# Patient Record
Sex: Female | Born: 1985 | Race: Black or African American | Hispanic: No | Marital: Single | Smoking: Never smoker
Health system: Southern US, Community
[De-identification: ages and names within clinical notes are randomized; demographics above are authoritative.]

## PROBLEM LIST (undated history)

## (undated) DIAGNOSIS — G43909 Migraine, unspecified, not intractable, without status migrainosus: Secondary | ICD-10-CM

## (undated) DIAGNOSIS — R55 Syncope and collapse: Secondary | ICD-10-CM

## (undated) HISTORY — DX: Migraine, unspecified, not intractable, without status migrainosus: G43.909

## (undated) HISTORY — PX: NO PAST SURGERIES: SHX2092

---

## 2004-12-05 ENCOUNTER — Encounter (INDEPENDENT_AMBULATORY_CARE_PROVIDER_SITE_OTHER): Payer: Self-pay | Admitting: *Deleted

## 2004-12-19 ENCOUNTER — Encounter (INDEPENDENT_AMBULATORY_CARE_PROVIDER_SITE_OTHER): Payer: Self-pay | Admitting: Specialist

## 2004-12-19 ENCOUNTER — Ambulatory Visit: Payer: Self-pay | Admitting: Family Medicine

## 2005-01-16 ENCOUNTER — Ambulatory Visit: Payer: Self-pay | Admitting: Family Medicine

## 2005-06-05 ENCOUNTER — Emergency Department (HOSPITAL_COMMUNITY): Admission: EM | Admit: 2005-06-05 | Discharge: 2005-06-05 | Payer: Self-pay | Admitting: Emergency Medicine

## 2005-12-25 ENCOUNTER — Emergency Department (HOSPITAL_COMMUNITY): Admission: EM | Admit: 2005-12-25 | Discharge: 2005-12-25 | Payer: Self-pay | Admitting: Emergency Medicine

## 2006-07-04 DIAGNOSIS — M545 Low back pain, unspecified: Secondary | ICD-10-CM | POA: Insufficient documentation

## 2006-07-04 DIAGNOSIS — G43909 Migraine, unspecified, not intractable, without status migrainosus: Secondary | ICD-10-CM | POA: Insufficient documentation

## 2006-07-04 DIAGNOSIS — F329 Major depressive disorder, single episode, unspecified: Secondary | ICD-10-CM | POA: Insufficient documentation

## 2006-07-05 ENCOUNTER — Encounter (INDEPENDENT_AMBULATORY_CARE_PROVIDER_SITE_OTHER): Payer: Self-pay | Admitting: *Deleted

## 2008-06-01 ENCOUNTER — Emergency Department (HOSPITAL_COMMUNITY): Admission: EM | Admit: 2008-06-01 | Discharge: 2008-06-01 | Payer: Self-pay | Admitting: Emergency Medicine

## 2009-07-03 ENCOUNTER — Emergency Department (HOSPITAL_COMMUNITY): Admission: EM | Admit: 2009-07-03 | Discharge: 2009-07-03 | Payer: Self-pay | Admitting: Family Medicine

## 2010-02-12 ENCOUNTER — Emergency Department (HOSPITAL_COMMUNITY)
Admission: EM | Admit: 2010-02-12 | Discharge: 2010-02-12 | Payer: Self-pay | Source: Home / Self Care | Admitting: Emergency Medicine

## 2010-08-21 LAB — COMPREHENSIVE METABOLIC PANEL
ALT: 10 U/L (ref 0–35)
AST: 15 U/L (ref 0–37)
Albumin: 3.6 g/dL (ref 3.5–5.2)
Alkaline Phosphatase: 44 U/L (ref 39–117)
BUN: 7 mg/dL (ref 6–23)
CO2: 27 mEq/L (ref 19–32)
Calcium: 9.2 mg/dL (ref 8.4–10.5)
Chloride: 106 mEq/L (ref 96–112)
Creatinine, Ser: 0.61 mg/dL (ref 0.4–1.2)
GFR calc Af Amer: >60 mL/min (ref >60)
GFR calc non Af Amer: >60 mL/min (ref >60)
Glucose, Bld: 97 mg/dL (ref 70–99)
Potassium: 3.8 mEq/L (ref 3.5–5.1)
Sodium: 136 mEq/L (ref 135–145)
Total Bilirubin: 0.5 mg/dL (ref 0.3–1.2)
Total Protein: 6.1 g/dL (ref 6.0–8.3)

## 2010-08-21 LAB — CBC
HCT: 34.7 % — ABNORMAL LOW (ref 36.0–46.0)
Hemoglobin: 11.4 g/dL — ABNORMAL LOW (ref 12.0–15.0)
MCHC: 32.8 g/dL (ref 30.0–36.0)
MCV: 89.8 fL (ref 78.0–100.0)
Platelets: 220 10*3/uL (ref 150–400)
RBC: 3.86 MIL/uL — ABNORMAL LOW (ref 3.87–5.11)
RDW: 12.8 % (ref 11.5–15.5)
WBC: 5.6 10*3/uL (ref 4.0–10.5)

## 2010-08-21 LAB — URINALYSIS, ROUTINE W REFLEX MICROSCOPIC
Bilirubin Urine: NEGATIVE
Ketones, ur: 15 mg/dL — AB
Nitrite: NEGATIVE
Specific Gravity, Urine: 1.027 (ref 1.005–1.030)
Urobilinogen, UA: 0.2 mg/dL (ref 0.0–1.0)
pH: 6 (ref 5.0–8.0)

## 2010-08-21 LAB — ABO/RH: ABO/RH(D): O POS

## 2010-08-21 LAB — DIFFERENTIAL
Basophils Absolute: 0 10*3/uL (ref 0.0–0.1)
Basophils Relative: 1 % (ref 0–1)
Eosinophils Absolute: 0.1 10*3/uL (ref 0.0–0.7)
Eosinophils Relative: 2 % (ref 0–5)
Lymphocytes Relative: 28 % (ref 12–46)
Lymphs Abs: 1.5 10*3/uL (ref 0.7–4.0)
Monocytes Absolute: 0.4 10*3/uL (ref 0.1–1.0)
Monocytes Relative: 7 % (ref 3–12)
Neutro Abs: 3.6 10*3/uL (ref 1.7–7.7)
Neutrophils Relative %: 64 % (ref 43–77)

## 2010-08-21 LAB — WET PREP, GENITAL: Trich, Wet Prep: NONE SEEN

## 2010-08-21 LAB — POCT PREGNANCY, URINE: Preg Test, Ur: POSITIVE

## 2010-08-21 LAB — GC/CHLAMYDIA PROBE AMP, GENITAL: Chlamydia, DNA Probe: NEGATIVE

## 2012-01-09 ENCOUNTER — Encounter (HOSPITAL_COMMUNITY): Payer: Self-pay | Admitting: Emergency Medicine

## 2012-01-09 ENCOUNTER — Emergency Department (HOSPITAL_COMMUNITY): Payer: Self-pay

## 2012-01-09 ENCOUNTER — Emergency Department (HOSPITAL_COMMUNITY)
Admission: EM | Admit: 2012-01-09 | Discharge: 2012-01-10 | Disposition: A | Discharge status: Home / Self Care | Payer: Self-pay | Attending: Emergency Medicine | Admitting: Emergency Medicine

## 2012-01-09 DIAGNOSIS — S92919A Unspecified fracture of unspecified toe(s), initial encounter for closed fracture: Secondary | ICD-10-CM | POA: Insufficient documentation

## 2012-01-09 DIAGNOSIS — S92514A Nondisplaced fracture of proximal phalanx of right lesser toe(s), initial encounter for closed fracture: Secondary | ICD-10-CM

## 2012-01-09 DIAGNOSIS — IMO0002 Reserved for concepts with insufficient information to code with codable children: Secondary | ICD-10-CM | POA: Insufficient documentation

## 2012-01-09 HISTORY — DX: Syncope and collapse: R55

## 2012-01-09 MED ORDER — IBUPROFEN 800 MG PO TABS: 800.0000 mg | ORAL_TABLET | Freq: Three times a day (TID) | ORAL | Status: AC | PRN

## 2012-01-09 MED ORDER — HYDROCODONE-ACETAMINOPHEN 5-325 MG PO TABS: 1.0000 | ORAL_TABLET | Freq: Four times a day (QID) | ORAL | Status: AC | PRN

## 2012-01-09 MED ORDER — OXYCODONE-ACETAMINOPHEN 5-325 MG PO TABS
1.0000 | ORAL_TABLET | Freq: Once | ORAL | Status: AC
  Administered 2012-01-09: 1 via ORAL
  Filled 2012-01-09: qty 1

## 2012-01-09 NOTE — ED Notes (Signed)
ICE PACK APPLIED AT INJURED AREA FOR COMFORT WHILE WAITING FOR PROVIDER.

## 2012-01-09 NOTE — ED Notes (Signed)
Pt reports hurting foot on Friday, by hitting on wall while running; pt reports having continued pain to 2,3,4,5th toes, also having pain when walking on top of foot with bruising and minimal swelling

## 2012-01-10 NOTE — ED Provider Notes (Signed)
History     CSN: [id number]  Arrival date & time 01/09/12  1952   First MD Initiated Contact with Patient 01/09/12 2254      Chief Complaint  Patient presents with  . Foot Injury    (Consider location/radiation/quality/duration/timing/severity/associated sxs/prior treatment) Patient is a 26 y.o. female presenting with foot injury. The history is provided by the patient.  Foot Injury  Incident onset: 6 days ago  The incident occurred at home (while running kicked a door). The injury mechanism was a direct blow. The pain is present in the right foot and right toes. The quality of the pain is described as throbbing. The pain is at a severity of 6/10. The pain is moderate. The pain has been constant since onset. Pertinent negatives include no numbness, no inability to bear weight, no loss of motion, no muscle weakness, no loss of sensation and no tingling. She reports no foreign bodies present. The symptoms are aggravated by palpation. She has tried nothing for the symptoms.    Past Medical History  Diagnosis Date  . Syncopal episodes     History reviewed. No pertinent past surgical history.  History reviewed. No pertinent family history.  History  Substance Use Topics  . Smoking status: Never Smoker   . Smokeless tobacco: Not on file  . Alcohol Use: Yes     occasion    OB History    Grav Para Term Preterm Abortions TAB SAB Ect Mult Living                  Review of Systems  Constitutional: Negative for fever, diaphoresis and activity change.  HENT: Negative for congestion and neck pain.   Respiratory: Negative for cough.   Genitourinary: Negative for dysuria.  Musculoskeletal: Negative for myalgias.  Skin: Negative for color change and wound.  Neurological: Negative for tingling, numbness and headaches.  All other systems reviewed and are negative.    Allergies  Review of patient's allergies indicates no known allergies.  Home Medications   Current Outpatient  Rx  Name Route Sig Dispense Refill  . HYDROCODONE-ACETAMINOPHEN 5-325 MG PO TABS Oral Take 1 tablet by mouth every 6 (six) hours as needed for pain. 15 tablet 0  . IBUPROFEN 800 MG PO TABS Oral Take 1 tablet (800 mg total) by mouth every 8 (eight) hours as needed for pain. 21 tablet 0    BP 113/74  Pulse 71  Temp 98.5 F (36.9 C) (Oral)  Resp 16  SpO2 98%  LMP 01/08/2012  Physical Exam  Nursing note and vitals reviewed. Constitutional: She appears well-developed and well-nourished. No distress.  HENT:  Head: Normocephalic and atraumatic.  Eyes: Conjunctivae and EOM are normal.  Neck: Normal range of motion. Neck supple.  Cardiovascular:       Intact distal pulses, capillary refill < 3 seconds  Musculoskeletal:       ttp over all toes on right foot, primarily the pinky toe. Right fore foot also tender. Pain with ROM of these areas. All other extremities with normal ROM  Neurological:       No sensory deficit  Skin: She is not diaphoretic.       Skin intact, no tenting    ED Course  Procedures (including critical care time)  Labs Reviewed - No data to display Dg Foot Complete Right  01/09/2012  *RADIOLOGY REPORT*  Clinical Data: Foot injury with pain.  RIGHT FOOT COMPLETE - 3+ VIEW  Comparison: 02/12/2010  Findings: Three-view exam  shows a transverse fracture through the little toe proximal phalanx.  No other acute fracture is evident. There is no subluxation or dislocation.  IMPRESSION: Transverse fracture involving the little toe proximal phalanx.   Original Report Authenticated By: ERIC A. MANSELL, M.D.      1. Closed nondisplaced fracture of proximal phalanx of lesser toe of right foot       MDM  Toe fracture  Patient X-Ray positive for fracture. Pain managed in ED. Pt advised to follow up with orthopedics if symptoms persist. Patient toes buddy taped, placed in postop boot and crutches given. Conservative therapy recommended and discussed. Patient will be dc home &  is agreeable with above plan.         Jaci Carrel, [place] 01/10/12 [phone]

## 2012-01-11 NOTE — ED Provider Notes (Signed)
History/physical exam/procedure(s) were performed by non-physician practitioner and as supervising physician I was immediately available for consultation/collaboration. I have reviewed all notes and am in agreement with care and plan.   Milayna Rotenberg [address] [place], MD 01/11/12 0118 

## 2013-02-06 ENCOUNTER — Other Ambulatory Visit: Payer: Self-pay | Admitting: Family Medicine

## 2013-02-06 ENCOUNTER — Other Ambulatory Visit (HOSPITAL_COMMUNITY)
Admission: RE | Admit: 2013-02-06 | Discharge: 2013-02-06 | Disposition: A | Discharge status: Home / Self Care | Payer: BC Managed Care – PPO | Source: Ambulatory Visit | Attending: Family Medicine | Admitting: Family Medicine

## 2013-02-06 DIAGNOSIS — Z124 Encounter for screening for malignant neoplasm of cervix: Secondary | ICD-10-CM | POA: Insufficient documentation

## 2013-02-06 DIAGNOSIS — Z1151 Encounter for screening for human papillomavirus (HPV): Secondary | ICD-10-CM | POA: Insufficient documentation

## 2013-02-06 DIAGNOSIS — R87810 Cervical high risk human papillomavirus (HPV) DNA test positive: Secondary | ICD-10-CM | POA: Insufficient documentation

## 2013-08-12 ENCOUNTER — Other Ambulatory Visit: Payer: Self-pay | Admitting: Family Medicine

## 2013-08-12 ENCOUNTER — Other Ambulatory Visit (HOSPITAL_COMMUNITY)
Admission: RE | Admit: 2013-08-12 | Discharge: 2013-08-12 | Disposition: A | Discharge status: Home / Self Care | Payer: BC Managed Care – PPO | Source: Ambulatory Visit | Attending: Family Medicine | Admitting: Family Medicine

## 2013-08-12 DIAGNOSIS — R87610 Atypical squamous cells of undetermined significance on cytologic smear of cervix (ASC-US): Secondary | ICD-10-CM | POA: Insufficient documentation

## 2013-08-27 ENCOUNTER — Emergency Department (INDEPENDENT_AMBULATORY_CARE_PROVIDER_SITE_OTHER): Payer: Worker's Compensation

## 2013-08-27 ENCOUNTER — Encounter (HOSPITAL_COMMUNITY): Payer: Self-pay | Admitting: Emergency Medicine

## 2013-08-27 ENCOUNTER — Emergency Department (INDEPENDENT_AMBULATORY_CARE_PROVIDER_SITE_OTHER)
Admission: EM | Admit: 2013-08-27 | Discharge: 2013-08-27 | Disposition: A | Discharge status: Home / Self Care | Payer: Worker's Compensation | Source: Home / Self Care | Attending: Family Medicine | Admitting: Family Medicine

## 2013-08-27 DIAGNOSIS — IMO0002 Reserved for concepts with insufficient information to code with codable children: Secondary | ICD-10-CM

## 2013-08-27 DIAGNOSIS — Y9229 Other specified public building as the place of occurrence of the external cause: Secondary | ICD-10-CM

## 2013-08-27 DIAGNOSIS — S9000XA Contusion of unspecified ankle, initial encounter: Secondary | ICD-10-CM | POA: Diagnosis not present

## 2013-08-27 MED ORDER — DICLOFENAC SODIUM 50 MG PO TBEC: 50.0000 mg | DELAYED_RELEASE_TABLET | Freq: Two times a day (BID) | ORAL | Status: DC | PRN

## 2013-08-27 NOTE — Discharge Instructions (Signed)
Thank you for coming in today. Use rest ice and elevation.  Take diclofenac as needed Return to work Monday  Contusion A contusion is the result of an injury to the skin and underlying tissues and is usually caused by direct trauma. The injury results in the appearance of a bruise on the skin overlying the injured tissues. Contusions cause rupture and bleeding of the small capillaries and blood vessels and affect function, because the bleeding infiltrates muscles, tendons, nerves, or other soft tissues.  SYMPTOMS   Swelling and often a hard lump in the injured area, either superficial or deep.  Pain and tenderness over the area of the contusion.  Feeling of firmness when pressure is exerted over the contusion.  Discoloration under the skin, beginning with redness and progressing to the characteristic "black and blue" bruise. CAUSES  A contusion is typically the result of direct trauma. This is often by a blunt object.  RISK INCREASES WITH:  Sports that have a high likelihood of trauma (football, boxing, ice hockey, soccer, field hockey, martial arts, basketball, and baseball).  Sports that make falling from a height likely (high-jumping, pole-vaulting, skating, or gymnastics).  Any bleeding disorder (hemophilia) or taking medications that affect clotting (aspirin, nonsteroidal anti-inflammatory medications, or warfarin [Coumadin]).  Inadequate protection of exposed areas during contact sports. PREVENTION  Maintain physical fitness:  Joint and muscle flexibility.  Strength and endurance.  Coordination.  Wear proper protective equipment. Make sure it fits correctly. PROGNOSIS  Contusions typically heal without any complications. Healing time varies with the severity of injury and intake of medications that affect clotting. Contusions usually heal in 1 to 4 weeks. RELATED COMPLICATIONS   Damage to nearby nerves or blood vessels, causing numbness, coldness, or  paleness.  Compartment syndrome.  Bleeding into the soft tissues that leads to disability.  Infiltrative-type bleeding, leading to the calcification and impaired function of the injured muscle (rare).  Prolonged healing time if usual activities are resumed too soon.  Infection if the skin over the injury site is broken.  Fracture of the bone underlying the contusion.  Stiffness in the joint where the injured muscle crosses. TREATMENT  Treatment initially consists of resting the injured area as well as medication and ice to reduce inflammation. The use of a compression bandage may also be helpful in minimizing inflammation. As pain diminishes and movement is tolerated, the joint where the affected muscle crosses should be moved to prevent stiffness and the shortening (contracture) of the joint. Movement of the joint should begin as soon as possible. It is also important to work on maintaining strength within the affected muscles. Occasionally, extra padding over the area of contusion may be recommended before returning to sports, particularly if re-injury is likely.  MEDICATION   If pain relief is necessary these medications are often recommended:  Nonsteroidal anti-inflammatory medications, such as aspirin and ibuprofen.  Other minor pain relievers, such as acetaminophen, are often recommended.  Prescription pain relievers may be given by your caregiver. Use only as directed and only as much as you need. HEAT AND COLD  Cold treatment (icing) relieves pain and reduces inflammation. Cold treatment should be applied for 10 to 15 minutes every 2 to 3 hours for inflammation and pain and immediately after any activity that aggravates your symptoms. Use ice packs or an ice massage. (To do an ice massage fill a large styrofoam cup with water and freeze. Tear a small amount of foam from the top so ice protrudes. Massage ice firmly  over the injured area in a circle about the size of a  softball.)  Heat treatment may be used prior to performing the stretching and strengthening activities prescribed by your caregiver, physical therapist, or athletic trainer. Use a heat pack or a warm soak. SEEK MEDICAL CARE IF:   Symptoms get worse or do not improve despite treatment in a few days.  You have difficulty moving a joint.  Any extremity becomes extremely painful, numb, pale, or cool (This is an emergency!).  Medication produces any side effects (bleeding, upset stomach, or allergic reaction).  Signs of infection (drainage from skin, headache, muscle aches, dizziness, fever, or general ill feeling) occur if skin was broken. Document Released: 04/23/2005 Document Revised: 07/16/2011 Document Reviewed: 08/05/2008 [facility] Patient Information 2014 [employer] [place].

## 2013-08-27 NOTE — ED Notes (Signed)
Pt reports she inj her right ankle at work on 4/21 Reports she was hit by a forklift on lateral part of right foot/ankle Reports pain w/activity No swelling visible; ambulated well to exam room w/NAD Alert w/no signs of acute distress.

## 2013-08-27 NOTE — ED Provider Notes (Signed)
Sabrina ReasonerSelina L Robinson is a 28 y.o. female who presents to Urgent Care today for right ankle and foot pain. Patient is a [employer] employee. At work on April 21 a forklift pushed the metal hamper into her right foot and ankle. She notes pain and swelling and tenderness along the lateral aspect of her ankle and foot. She notes some pain with ambulation. She denies any significant radiating pain weakness or numbness. She's tried ice rest and elevation which have not helped much. She has not tried any medications. She feels well otherwise. She's here today for evaluation.   Past Medical History  Diagnosis Date  . Syncopal episodes    History  Substance Use Topics  . Smoking status: Never Smoker   . Smokeless tobacco: Not on file  . Alcohol Use: Yes     Comment: occasion   ROS as above Medications: No current facility-administered medications for this encounter.   Current Outpatient Prescriptions  Medication Sig Dispense Refill  . norethindrone-ethinyl estradiol (MICROGESTIN,JUNEL,LOESTRIN) 1-20 MG-MCG tablet Take 1 tablet by mouth daily.      . diclofenac (VOLTAREN) 50 MG EC tablet Take 1 tablet (50 mg total) by mouth 2 (two) times daily as needed.  60 tablet  0    Exam:  BP 116/61  Pulse 84  Temp(Src) 98.1 F (36.7 C) (Oral)  Resp 16  SpO2 100%  LMP 08/16/2013 Gen: Well NAD Right ankle: Normal-appearing no significant swelling. Tender palpation of the lateral malleolus. Range of motion is intact.  Ankle stable to ligament testing.  Capillary refill sensation and pulses are intact.  Right foot: Tender along the fifth metatarsal and phalanx.  Motion is intact.   No results found for this or any previous visit (from the past 24 hour(s)). Dg Ankle Complete Right  08/27/2013   CLINICAL DATA:  ANKLE INJURY  EXAM: RIGHT ANKLE - COMPLETE 3+ VIEW  COMPARISON:  None.  FINDINGS: There is no evidence of fracture, dislocation, or joint effusion. There is no evidence of arthropathy or other focal bone  abnormality. Soft tissues are unremarkable.  IMPRESSION: Negative.   Electronically Signed   By: Salome HolmesHector  Cooper M.D.   On: 08/27/2013 17:42   Dg Foot Complete Right  08/27/2013   CLINICAL DATA:  Trauma, injury, pain  EXAM: RIGHT FOOT COMPLETE - 3+ VIEW  COMPARISON:  08/27/2013  FINDINGS: There is no evidence of fracture or dislocation. There is no evidence of arthropathy or other focal bone abnormality. Soft tissues are unremarkable.  IMPRESSION: No acute osseous finding.   Electronically Signed   By: Ruel Favorsrevor  Shick M.D.   On: 08/27/2013 17:42    Assessment and Plan: 28 y.o. female with foot and ankle contusion. Plan to use diclofenac. Work note provided. Post office employment form filled out. Followup with primary care provider.  Discussed warning signs or symptoms. Please see discharge instructions. Patient expresses understanding.    Rodolph BongEvan S Lucian Baswell, MD 08/27/13 [phone]

## 2014-05-06 ENCOUNTER — Other Ambulatory Visit: Payer: Self-pay | Admitting: Family Medicine

## 2014-05-06 ENCOUNTER — Other Ambulatory Visit (HOSPITAL_COMMUNITY)
Admission: RE | Admit: 2014-05-06 | Discharge: 2014-05-06 | Disposition: A | Discharge status: Home / Self Care | Payer: BC Managed Care – PPO | Source: Ambulatory Visit | Attending: Family Medicine | Admitting: Family Medicine

## 2014-05-06 DIAGNOSIS — R87610 Atypical squamous cells of undetermined significance on cytologic smear of cervix (ASC-US): Secondary | ICD-10-CM | POA: Diagnosis present

## 2014-05-06 DIAGNOSIS — Z01411 Encounter for gynecological examination (general) (routine) with abnormal findings: Secondary | ICD-10-CM | POA: Diagnosis not present

## 2014-05-06 DIAGNOSIS — Z1151 Encounter for screening for human papillomavirus (HPV): Secondary | ICD-10-CM | POA: Diagnosis present

## 2014-05-11 LAB — CYTOLOGY - PAP

## 2014-05-31 ENCOUNTER — Other Ambulatory Visit: Payer: Self-pay | Admitting: Obstetrics and Gynecology

## 2014-12-13 ENCOUNTER — Other Ambulatory Visit: Payer: Self-pay | Admitting: Obstetrics and Gynecology

## 2014-12-13 ENCOUNTER — Other Ambulatory Visit (HOSPITAL_COMMUNITY)
Admission: RE | Admit: 2014-12-13 | Discharge: 2014-12-13 | Disposition: A | Discharge status: Home / Self Care | Payer: Federal, State, Local not specified - PPO | Source: Ambulatory Visit | Attending: Obstetrics and Gynecology | Admitting: Obstetrics and Gynecology

## 2014-12-13 DIAGNOSIS — Z01419 Encounter for gynecological examination (general) (routine) without abnormal findings: Secondary | ICD-10-CM | POA: Diagnosis present

## 2014-12-15 LAB — CYTOLOGY - PAP

## 2015-06-29 ENCOUNTER — Other Ambulatory Visit: Payer: Self-pay | Admitting: Obstetrics and Gynecology

## 2015-06-29 ENCOUNTER — Other Ambulatory Visit (HOSPITAL_COMMUNITY)
Admission: RE | Admit: 2015-06-29 | Discharge: 2015-06-29 | Disposition: A | Discharge status: Home / Self Care | Payer: Federal, State, Local not specified - PPO | Source: Ambulatory Visit | Attending: Obstetrics and Gynecology | Admitting: Obstetrics and Gynecology

## 2015-06-29 DIAGNOSIS — Z01419 Encounter for gynecological examination (general) (routine) without abnormal findings: Secondary | ICD-10-CM | POA: Diagnosis present

## 2015-07-01 LAB — CYTOLOGY - PAP

## 2015-07-11 ENCOUNTER — Ambulatory Visit: Payer: Self-pay | Admitting: Neurology

## 2015-08-17 ENCOUNTER — Ambulatory Visit: Payer: Self-pay | Admitting: Neurology

## 2015-08-17 DIAGNOSIS — Z029 Encounter for administrative examinations, unspecified: Secondary | ICD-10-CM

## 2015-11-10 ENCOUNTER — Ambulatory Visit (INDEPENDENT_AMBULATORY_CARE_PROVIDER_SITE_OTHER): Payer: Federal, State, Local not specified - PPO | Admitting: Neurology

## 2015-11-10 ENCOUNTER — Encounter: Payer: Self-pay | Admitting: Neurology

## 2015-11-10 VITALS — BP 116/78 | HR 67 | Ht 63.25 in | Wt 144.8 lb

## 2015-11-10 DIAGNOSIS — R55 Syncope and collapse: Secondary | ICD-10-CM | POA: Diagnosis not present

## 2015-11-10 DIAGNOSIS — G43009 Migraine without aura, not intractable, without status migrainosus: Secondary | ICD-10-CM

## 2015-11-10 DIAGNOSIS — IMO0002 Reserved for concepts with insufficient information to code with codable children: Secondary | ICD-10-CM

## 2015-11-10 DIAGNOSIS — R404 Transient alteration of awareness: Secondary | ICD-10-CM | POA: Insufficient documentation

## 2015-11-10 MED ORDER — NORTRIPTYLINE HCL 10 MG PO CAPS: 20.0000 mg | ORAL_CAPSULE | Freq: Every day | ORAL | Status: DC

## 2015-11-10 MED ORDER — ONDANSETRON 4 MG PO TBDP: 4.0000 mg | ORAL_TABLET | Freq: Three times a day (TID) | ORAL | Status: DC | PRN

## 2015-11-10 MED ORDER — RIZATRIPTAN BENZOATE 10 MG PO TBDP: 10.0000 mg | ORAL_TABLET | ORAL | Status: DC | PRN

## 2015-11-10 NOTE — Progress Notes (Signed)
PATIENT: Sabrina ReasonerSelina L Robinson DOB: November 13, 1985  Chief Complaint  Patient presents with  . Migraine    She is here to discuss migraine management (diagnosed in elementary school). Report migraines to be more frequent and severe recently.  She estimates 2-3 migraine days weekly.  Headaches are often associated with nausea, visual disturbances and dizziness.  She has been on Topamax in the past but was unable to tolerate the side effects.  She has had trigger point injections in the past.  She is currently just using sumatriptan 100mg  tablets for pain, which has not been helpful.     HISTORICAL  Sabrina ReasonerSelina L [name]  is a 30 years old right-handed female,seen in refer by  her primary care doctor Consuella Loselaine Gfor evaluation of chronic migraine headache in November 10 2015  She reported history of migraine headaches since middle school, her typical migraine are right lateralized severe pounding headache with associated light noise sensitivity, nauseous, spreceded by a gradual building up of pressure headache at right temporoparietal region, since2016 she has increased frequency of headache, about couple times a week, to currently daily basis now since May 2017,  She was evaluated by headache specialist Dr. Neale BurlyFreeman in 2015, has tried preventive medication Topamax up to 100 mg twice a day, she could not tolerate it due to heart flutter, dizziness, there was no noticeable improvement in her headache after 6 months. She also received trigger point injection without significant improvement,  For abortive treatment, She has tried Imitrex since May 2017, she tried 3 times, it works well for her headaches take away her headache in 2 hours, but she had choking sensation, it is unbearable for her,  Trigger for her migraines are stress, sleep deprivation, weather change, hungry, dehydration, strong smells, bright light,  Because of frequent headaches, she has been taking BC powder at least 4 times a week, up to 3 packs each day,  frequent almost daily Excedrin Migraine use, for over one year, also tried different over-the-counter medication aspirin Tylenol ibuprofen Aleve without helping her headaches  Today she is also very concerned about her passing out episode, she had multiple episodes since 2002, each episode is similar, she described to episode happened while taking a hot shower, she had sudden onset dizziness, wooshing sound in her ear, blurry vision, and transient loss of consciousness quickly recovered after she landed on the floor, there was no seizure-like activity noticed,  some of the episode triggered by painful stimulation, such as broken her toes.  REVIEW OF SYSTEMS: Full 14 system review of systems performed and notable only for as above  ALLERGIES: No Known Allergies  HOME MEDICATIONS: Current Outpatient Prescriptions  Medication Sig Dispense Refill  . norethindrone-ethinyl estradiol (MICROGESTIN,JUNEL,LOESTRIN) 1-20 MG-MCG tablet Take 1 tablet by mouth daily.    . SUMAtriptan (IMITREX) 100 MG tablet Take 100 mg by mouth as needed for migraine. May repeat in 2 hours if headache persists or recurs.     No current facility-administered medications for this visit.    PAST MEDICAL HISTORY: Past Medical History  Diagnosis Date  . Syncopal episodes   . Migraines     PAST SURGICAL HISTORY: Past Surgical History  Procedure Laterality Date  . No past surgeries      FAMILY HISTORY: Family History  Problem Relation Age of Onset  . Hypertension Mother     SOCIAL HISTORY:  Social History   Social History  . Marital Status: Single    Spouse Name: N/A  . Number of  Children: 0  . Years of Education: Bachelors   Occupational History  . 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 Smoking status: Never Smoker   . Smokeless tobacco: Not on file  . Alcohol Use: 0.0 oz/week    0 Standard drinks or equivalent per week     Comment: occasionally  . Drug Use: No  . Sexual Activity:  Not on file   Other Topics Concern  . Not on file   Social History Narrative   Lives at home with her mother.   Right-handed.   1 cup caffeine daily.     PHYSICAL EXAM   Filed Vitals:   11/10/15 0952  BP: 116/78  Pulse: 67  Height: 5' 3.25" (1.607 m)  Weight: 144 lb 12 oz (65.658 kg)    Not recorded      Body mass index is 25.42 kg/(m^2).  PHYSICAL EXAMNIATION:  Gen: NAD, conversant, well nourised, obese, well groomed                     Cardiovascular: Regular rate rhythm, no peripheral edema, warm, nontender. Eyes: Conjunctivae clear without exudates or hemorrhage Neck: Supple, no carotid bruise. Pulmonary: Clear to auscultation bilaterally   NEUROLOGICAL EXAM:  MENTAL STATUS: Speech:    Speech is normal; fluent and spontaneous with normal comprehension.  Cognition:     Orientation to time, place and person     Normal recent and remote memory     Normal Attention span and concentration     Normal Language, naming, repeating,spontaneous speech     Fund of knowledge   CRANIAL NERVES: CN II: Visual fields are full to confrontation. Fundoscopic exam is normal with sharp discs and no vascular changes. Pupils are round equal and briskly reactive to light. CN III, IV, VI: extraocular movement are normal. No ptosis. CN V: Facial sensation is intact to pinprick in all 3 divisions bilaterally. Corneal responses are intact.  CN VII: Face is symmetric with normal eye closure and smile. CN VIII: Hearing is normal to rubbing fingers CN IX, X: Palate elevates symmetrically. Phonation is normal. CN XI: Head turning and shoulder shrug are intact CN XII: Tongue is midline with normal movements and no atrophy.  MOTOR: There is no pronator drift of out-stretched arms. Muscle bulk and tone are normal. Muscle strength is normal.  REFLEXES: Reflexes are 2+ and symmetric at the biceps, triceps, knees, and ankles. Plantar responses are flexor.  SENSORY: Intact to light touch,  pinprick, positional sensation and vibratory sensation are intact in fingers and toes.  COORDINATION: Rapid alternating movements and fine finger movements are intact. There is no dysmetria on finger-to-nose and heel-knee-shin.    GAIT/STANCE: Posture is normal. Gait is steady with normal steps, base, arm swing, and turning. Heel and toe walking are normal. Tandem gait is normal.  Romberg is absent.   DIAGNOSTIC DATA (LABS, IMAGING, TESTING) - I reviewed patient records, labs, notes, testing and imaging myself where available.   ASSESSMENT AND PLAN  DONALYN SCHNEEBERGER is a 30 y.o. female   Chronic migraine headaches,  A component of medicine rebound headache  I have advised her stop daily over-the-counter medication use,  Passing out episodes  Most suggestive of syncope episodes  She desires further evaluation, proceed with MRI of the brain with without contrast EEG  Laboratory evaluations,   Levert Feinstein, M.D. Ph.D.  [facility] [address] [place], [place] [address] Ph: [phone])  [phone] Fax: [phone]  CC: Maurice SmallElaine Griffin, MD

## 2015-11-11 LAB — COMPREHENSIVE METABOLIC PANEL
ALBUMIN: 3.5 g/dL (ref 3.5–5.5)
ALT: 8 IU/L (ref 0–32)
AST: 15 IU/L (ref 0–40)
Albumin/Globulin Ratio: 1.3 (ref 1.2–2.2)
Alkaline Phosphatase: 53 IU/L (ref 39–117)
BUN / CREAT RATIO: 14 (ref 9–23)
BUN: 11 mg/dL (ref 6–20)
Bilirubin Total: <0.2 mg/dL (ref 0.0–1.2)
CALCIUM: 8.6 mg/dL — AB (ref 8.7–10.2)
CO2: 21 mmol/L (ref 18–29)
CREATININE: 0.77 mg/dL (ref 0.57–1.00)
Chloride: 102 mmol/L (ref 96–106)
GFR calc Af Amer: 120 mL/min/{1.73_m2} (ref >59)
GFR, EST NON AFRICAN AMERICAN: 104 mL/min/{1.73_m2} (ref >59)
GLOBULIN, TOTAL: 2.6 g/dL (ref 1.5–4.5)
Glucose: 86 mg/dL (ref 65–99)
POTASSIUM: 4.4 mmol/L (ref 3.5–5.2)
SODIUM: 139 mmol/L (ref 134–144)
Total Protein: 6.1 g/dL (ref 6.0–8.5)

## 2015-11-11 LAB — CBC
HEMATOCRIT: 36 % (ref 34.0–46.6)
HEMOGLOBIN: 11.1 g/dL (ref 11.1–15.9)
MCH: 27.7 pg (ref 26.6–33.0)
MCHC: 30.8 g/dL — AB (ref 31.5–35.7)
MCV: 90 fL (ref 79–97)
Platelets: 255 10*3/uL (ref 150–379)
RBC: 4.01 x10E6/uL (ref 3.77–5.28)
RDW: 14.4 % (ref 12.3–15.4)
WBC: 4.4 10*3/uL (ref 3.4–10.8)

## 2015-11-11 LAB — VITAMIN B12: Vitamin B-12: 247 pg/mL (ref 211–946)

## 2015-11-11 LAB — TSH: TSH: 2.81 u[IU]/mL (ref 0.450–4.500)

## 2015-11-22 ENCOUNTER — Encounter: Payer: Self-pay | Admitting: Neurology

## 2015-11-22 ENCOUNTER — Ambulatory Visit (INDEPENDENT_AMBULATORY_CARE_PROVIDER_SITE_OTHER): Payer: Federal, State, Local not specified - PPO | Admitting: Neurology

## 2015-11-22 DIAGNOSIS — R55 Syncope and collapse: Secondary | ICD-10-CM

## 2015-11-22 DIAGNOSIS — IMO0002 Reserved for concepts with insufficient information to code with codable children: Secondary | ICD-10-CM

## 2015-11-22 DIAGNOSIS — G43009 Migraine without aura, not intractable, without status migrainosus: Secondary | ICD-10-CM

## 2015-11-22 NOTE — Procedures (Signed)
   HISTORY: 30 years old female, presented with frequent migraine headaches, she also has passing out episode.  TECHNIQUE:  16 channel EEG was performed based on standard 10-16 international system. One channel was dedicated to EKG, which has demonstrates normal sinus rhythm of 90 beats per minutes.  Upon awakening, the posterior background activity was well-developed, in alpha range, 9 Hz, reactive to eye opening and closure.  There was no evidence of epileptiform discharge.  Photic stimulation was performed, which induced a symmetric photic driving.  Hyperventilation was performed, there was no abnormality elicit.  No sleep was achieved.  CONCLUSION: This is a  normal awake EEG.  There is no electrodiagnostic evidence of epileptiform discharge.  Levert FeinsteinYijun Billy Turvey, M.D. Ph.D.  [facility] [address] [place], [place] [address] Phone: [phone] Fax:      [phone]

## 2015-11-23 ENCOUNTER — Ambulatory Visit (INDEPENDENT_AMBULATORY_CARE_PROVIDER_SITE_OTHER): Payer: Federal, State, Local not specified - PPO

## 2015-11-23 DIAGNOSIS — R55 Syncope and collapse: Secondary | ICD-10-CM | POA: Diagnosis not present

## 2015-11-23 DIAGNOSIS — IMO0002 Reserved for concepts with insufficient information to code with codable children: Secondary | ICD-10-CM

## 2015-11-23 DIAGNOSIS — G43009 Migraine without aura, not intractable, without status migrainosus: Secondary | ICD-10-CM | POA: Diagnosis not present

## 2015-11-24 MED ORDER — GADOPENTETATE DIMEGLUMINE 469.01 MG/ML IV SOLN: 15.0000 mL | Freq: Once | INTRAVENOUS | Status: DC | PRN

## 2016-02-02 DIAGNOSIS — R197 Diarrhea, unspecified: Secondary | ICD-10-CM | POA: Diagnosis not present

## 2016-02-02 DIAGNOSIS — R112 Nausea with vomiting, unspecified: Secondary | ICD-10-CM | POA: Diagnosis not present

## 2016-02-02 DIAGNOSIS — R109 Unspecified abdominal pain: Secondary | ICD-10-CM | POA: Diagnosis not present

## 2016-02-16 ENCOUNTER — Ambulatory Visit: Payer: Federal, State, Local not specified - PPO | Admitting: Neurology

## 2016-05-10 DIAGNOSIS — Z0289 Encounter for other administrative examinations: Secondary | ICD-10-CM

## 2016-07-06 ENCOUNTER — Other Ambulatory Visit: Payer: Self-pay | Admitting: Obstetrics and Gynecology

## 2016-07-06 ENCOUNTER — Other Ambulatory Visit (HOSPITAL_COMMUNITY)
Admission: RE | Admit: 2016-07-06 | Discharge: 2016-07-06 | Disposition: A | Discharge status: Home / Self Care | Payer: Federal, State, Local not specified - PPO | Source: Ambulatory Visit | Attending: Obstetrics and Gynecology | Admitting: Obstetrics and Gynecology

## 2016-07-06 DIAGNOSIS — Z1151 Encounter for screening for human papillomavirus (HPV): Secondary | ICD-10-CM | POA: Diagnosis not present

## 2016-07-06 DIAGNOSIS — Z01419 Encounter for gynecological examination (general) (routine) without abnormal findings: Secondary | ICD-10-CM | POA: Diagnosis not present

## 2016-07-11 LAB — CYTOLOGY - PAP
DIAGNOSIS: NEGATIVE
HPV: NOT DETECTED

## 2016-07-19 ENCOUNTER — Ambulatory Visit (INDEPENDENT_AMBULATORY_CARE_PROVIDER_SITE_OTHER): Payer: Federal, State, Local not specified - PPO | Admitting: Neurology

## 2016-07-19 ENCOUNTER — Encounter: Payer: Self-pay | Admitting: Neurology

## 2016-07-19 VITALS — BP 116/77 | HR 71 | Ht 63.25 in | Wt 141.0 lb

## 2016-07-19 DIAGNOSIS — G43009 Migraine without aura, not intractable, without status migrainosus: Secondary | ICD-10-CM

## 2016-07-19 DIAGNOSIS — R55 Syncope and collapse: Secondary | ICD-10-CM | POA: Diagnosis not present

## 2016-07-19 DIAGNOSIS — IMO0002 Reserved for concepts with insufficient information to code with codable children: Secondary | ICD-10-CM

## 2016-07-19 MED ORDER — NORTRIPTYLINE HCL 25 MG PO CAPS: 50.0000 mg | ORAL_CAPSULE | Freq: Every day | ORAL | 4 refills | Status: DC

## 2016-07-19 MED ORDER — RIZATRIPTAN BENZOATE 10 MG PO TBDP: 10.0000 mg | ORAL_TABLET | ORAL | 11 refills | Status: DC | PRN

## 2016-07-19 NOTE — Patient Instructions (Signed)
May take Maxalt together with 2 tablets of Aleve, Zofran, even Benadryl, and then go to sleep for prolonged, severe headaches.

## 2016-07-19 NOTE — Progress Notes (Signed)
PATIENT: Sabrina Robinson DOB: 1985/11/28  Chief Complaint  Patient presents with  . Migraine    She does not feel the frequency of her migraines has reduced with nortriptyline 20mg , qhs.  Reports still having 3-4 migraines monthly.  Feels Maxalt works well for her pain.     HISTORICAL  Sabrina Robinson  is a 31 years old right-handed female,seen in refer by  her primary care doctor Consuella Loselaine Gfor evaluation of chronic migraine headache in November 10 2015  She reported history of migraine headaches since middle school, her typical migraine are right lateralized severe pounding headache with associated light noise sensitivity, nauseous, spreceded by a gradual building up of pressure headache at right temporoparietal region, since2016 she has increased frequency of headache, about couple times a week, to currently daily basis now since May 2017,  She was evaluated by headache specialist Dr. Neale BurlyFreeman in 2015, has tried preventive medication Topamax up to 100 mg twice a day, she could not tolerate it due to heart flutter, dizziness, there was no noticeable improvement in her headache after 6 months. She also received trigger point injection without significant improvement,  For abortive treatment, She has tried Imitrex since May 2017, she tried 3 times, it works well for her headaches take away her headache in 2 hours, but she had choking sensation, it is unbearable for her,  Trigger for her migraines are stress, sleep deprivation, weather change, hungry, dehydration, strong smells, bright light,  Because of frequent headaches, she has been taking BC powder at least 4 times a week, up to 3 packs each day, frequent almost daily Excedrin Migraine use, for over one year, also tried different over-the-counter medication aspirin Tylenol ibuprofen Aleve without helping her headaches  Today she is also very concerned about her passing out episode, she had multiple episodes since 2002, each episode is similar, she  described to episode happened while taking a hot shower, she had sudden onset dizziness, wooshing sound in her ear, blurry vision, and transient loss of consciousness quickly recovered after she landed on the floor, there was no seizure-like activity noticed,  some of the episode triggered by painful stimulation, such as broken her toes.  UPDATE July 19 2016: I have personally reviewed MRI of the brain with without contrast in July 2017, no acute intracranial abnormality. EEG was normal in July 2017 Laboratory in July 2017, normal CMP, CBC, TSH, B12  She has no more passing out episode, she continue have migraine 2-3 times each months, still has to take Maxalt 2-3 time each month, about 1 hour to abort the headache, but she feels fatigued afterwards.   REVIEW OF SYSTEMS: Full 14 system review of systems performed and notable only for as above  ALLERGIES: No Known Allergies  HOME MEDICATIONS: Current Outpatient Prescriptions  Medication Sig Dispense Refill  . norethindrone-ethinyl estradiol (MICROGESTIN,JUNEL,LOESTRIN) 1-20 MG-MCG tablet Take 1 tablet by mouth daily.    . nortriptyline (PAMELOR) 10 MG capsule Take 2 capsules (20 mg total) by mouth at bedtime. 60 capsule 11  . ondansetron (ZOFRAN ODT) 4 MG disintegrating tablet Take 1 tablet (4 mg total) by mouth every 8 (eight) hours as needed for nausea or vomiting. 20 tablet 3  . rizatriptan (MAXALT-MLT) 10 MG disintegrating tablet Take 1 tablet (10 mg total) by mouth as needed. May repeat in 2 hours if needed 15 tablet 6  . SUMAtriptan (IMITREX) 100 MG tablet Take 100 mg by mouth as needed for migraine. May repeat in 2  hours if headache persists or recurs.     No current facility-administered medications for this visit.    Facility-Administered Medications Ordered in Other Visits  Medication Dose Route Frequency Provider Last Rate Last Dose  . gadopentetate dimeglumine (MAGNEVIST) injection 15 mL  15 mL Intravenous Once PRN Levert Feinstein,  MD        PAST MEDICAL HISTORY: Past Medical History:  Diagnosis Date  . Migraines   . Syncopal episodes     PAST SURGICAL HISTORY: Past Surgical History:  Procedure Laterality Date  . NO PAST SURGERIES      FAMILY HISTORY: Family History  Problem Relation Age of Onset  . Hypertension Mother     SOCIAL HISTORY:  Social History   Social History  . Marital status: Single    Spouse name: N/A  . Number of children: 0  . 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 Smoking status: Never Smoker  . Smokeless tobacco: Never Used  . Alcohol use 0.0 oz/week     Comment: occasionally  . Drug use: No  . Sexual activity: Not on file   Other Topics Concern  . Not on file   Social History Narrative   Lives at home with her mother.   Right-handed.   1 cup caffeine daily.     PHYSICAL EXAM   Vitals:   07/19/16 1211  BP: 116/77  Pulse: 71  Weight: 141 lb (64 kg)  Height: 5' 3.25" (1.607 m)    Not recorded      Body mass index is 24.78 kg/m.  PHYSICAL EXAMNIATION:  Gen: NAD, conversant, well nourised, obese, well groomed                     Cardiovascular: Regular rate rhythm, no peripheral edema, warm, nontender. Eyes: Conjunctivae clear without exudates or hemorrhage Neck: Supple, no carotid bruise. Pulmonary: Clear to auscultation bilaterally   NEUROLOGICAL EXAM:  MENTAL STATUS: Speech:    Speech is normal; fluent and spontaneous with normal comprehension.  Cognition:     Orientation to time, place and person     Normal recent and remote memory     Normal Attention span and concentration     Normal Language, naming, repeating,spontaneous speech     Fund of knowledge   CRANIAL NERVES: CN II: Visual fields are full to confrontation. Fundoscopic exam is normal with sharp discs and no vascular changes. Pupils are round equal and briskly reactive to light. CN III, IV, VI:  extraocular movement are normal. No ptosis. CN V: Facial sensation is intact to pinprick in all 3 divisions bilaterally. Corneal responses are intact.  CN VII: Face is symmetric with normal eye closure and smile. CN VIII: Hearing is normal to rubbing fingers CN IX, X: Palate elevates symmetrically. Phonation is normal. CN XI: Head turning and shoulder shrug are intact CN XII: Tongue is midline with normal movements and no atrophy.  MOTOR: There is no pronator drift of out-stretched arms. Muscle bulk and tone are normal. Muscle strength is normal.  REFLEXES: Reflexes are 2+ and symmetric at the biceps, triceps, knees, and ankles. Plantar responses are flexor.  SENSORY: Intact to light touch, pinprick, positional sensation and vibratory sensation are intact in fingers and toes.  COORDINATION: Rapid alternating movements and fine finger movements are intact. There is no dysmetria on finger-to-nose and heel-knee-shin.    GAIT/STANCE: Posture is normal. Gait  is steady with normal steps, base, arm swing, and turning. Heel and toe walking are normal. Tandem gait is normal.  Romberg is absent.   DIAGNOSTIC DATA (LABS, IMAGING, TESTING) - I reviewed patient records, labs, notes, testing and imaging myself where available.   ASSESSMENT AND PLAN  Sabrina Robinson is a 31 y.o. female   Chronic migraine headaches,  She has stopped frequent over-the-counter medication use  Maxalt works well for her most of the time  I have suggested her for prolonged protracted migraine, she may take Maxalt together with Aleve and Zofran as needed.,  Will increase her nortriptyline to 25 mg 2 tablets every night as preventative medications   passing out episode  Most consistent with syncope, she has no recurrent episode recently.  Levert Feinstein, M.D. Ph.D.  [facility] [address] [place], [place] [address] Ph: [phone] Fax: [phone]  CC: Maurice Small, MD

## 2016-09-25 DIAGNOSIS — N912 Amenorrhea, unspecified: Secondary | ICD-10-CM | POA: Diagnosis not present

## 2016-10-08 DIAGNOSIS — K080 Exfoliation of teeth due to systemic causes: Secondary | ICD-10-CM | POA: Diagnosis not present

## 2016-10-31 DIAGNOSIS — K080 Exfoliation of teeth due to systemic causes: Secondary | ICD-10-CM | POA: Diagnosis not present

## 2017-01-02 DIAGNOSIS — N898 Other specified noninflammatory disorders of vagina: Secondary | ICD-10-CM | POA: Diagnosis not present

## 2017-01-22 ENCOUNTER — Ambulatory Visit: Payer: Federal, State, Local not specified - PPO | Admitting: Nurse Practitioner

## 2017-02-27 NOTE — Progress Notes (Signed)
[facility]  PATIENT: Sabrina Robinson DOB: 05-18-1985   REASON FOR VISIT: Follow-up for migraines, episodes of passing out HISTORY FROM: Patient    HISTORY OF PRESENT ILLNESS:Sabrina Robinson  is a 31 years old right-handed female,seen in refer by  her primary care doctor Consuella Lose Gfor evaluation of chronic migraine headache in November 10 2015  She reported history of migraine headaches since middle school, her typical migraine are right lateralized severe pounding headache with associated light noise sensitivity, nauseous, spreceded by a gradual building up of pressure headache at right temporoparietal region, since2016 she has increased frequency of headache, about couple times a week, to currently daily basis now since May 2017,  She was evaluated by headache specialist Dr. Neale Burly in 2015, has tried preventive medication Topamax up to 100 mg twice a day, she could not tolerate it due to heart flutter, dizziness, there was no noticeable improvement in her headache after 6 months. She also received trigger point injection without significant improvement,  For abortive treatment, She has tried Imitrex since May 2017, she tried 3 times, it works well for her headaches take away her headache in 2 hours, but she had choking sensation, it is unbearable for her,  Trigger for her migraines are stress, sleep deprivation, weather change, hungry, dehydration, strong smells, bright light, Because of frequent headaches, she has been taking BC powder at least 4 times a week, up to 3 packs each day, frequent almost daily Excedrin Migraine use, for over one year, also tried different over-the-counter medication aspirin Tylenol ibuprofen Aleve without helping her headaches Today she is also very concerned about her passing out episode, she had multiple episodes since 2002, each episode is similar, she described to episode happened while taking a hot shower, she had sudden onset dizziness,  wooshing sound in her ear, blurry vision, and transient loss of consciousness quickly recovered after she landed on the floor, there was no seizure-like activity noticed,  some of the episode triggered by painful stimulation, such as broken her toes.  UPDATE July 19 2016:YY I have personally reviewed MRI of the brain with without contrast in July 2017, no acute intracranial abnormality. EEG was normal in July 2017 Laboratory in July 2017, normal CMP, CBC, TSH, B12  She has no more passing out episode, she continue have migraine 2-3 times each months, still has to take Maxalt 2-3 time each month, about 1 hour to abort the headache, but she feels fatigued afterwards.  UPDATE 10/25/2018[name], 31 year old female returns for follow-up for migraines and passing out episodes. She is having approximately 2 migraines per month usually around her cycle. She denies any syncopal episodes since last seen. Maxalt works acutely but she is still fatigued afterwards. Her migraine triggers are bright lights,  strong smells, lack of sleep dehydration and weather changes. She returns for reevaluation   REVIEW OF SYSTEMS: Full 14 system review of systems performed and notable only for those listed, all others are neg:  Constitutional: neg  Cardiovascular: neg Ear/Nose/Throat: neg  Skin: neg Eyes: neg Respiratory: neg Gastroitestinal: neg  Hematology/Lymphatic: neg  Endocrine: neg Musculoskeletal:neg Allergy/Immunology: neg Neurological: Migraine headaches Psychiatric: neg Sleep : neg   ALLERGIES: No Known Allergies  HOME MEDICATIONS: Outpatient Medications Prior to Visit  Medication Sig Dispense Refill  . norethindrone-ethinyl estradiol (MICROGESTIN,JUNEL,LOESTRIN) 1-20 MG-MCG tablet Take 1 tablet by mouth daily.    . nortriptyline (PAMELOR) 25 MG capsule Take 2 capsules (50 mg total) by mouth at bedtime. 180 capsule  4  . ondansetron (ZOFRAN ODT) 4 MG disintegrating tablet Take 1 tablet (4  mg total) by mouth every 8 (eight) hours as needed for nausea or vomiting. 20 tablet 3  . rizatriptan (MAXALT-MLT) 10 MG disintegrating tablet Take 1 tablet (10 mg total) by mouth as needed. May repeat in 2 hours if needed 15 tablet 11   No facility-administered medications prior to visit.     PAST MEDICAL HISTORY: Past Medical History:  Diagnosis Date  . Migraines   . Syncopal episodes     PAST SURGICAL HISTORY: Past Surgical History:  Procedure Laterality Date  . NO PAST SURGERIES      FAMILY HISTORY: Family History  Problem Relation Age of Onset  . Hypertension Mother     SOCIAL HISTORY: Social History   Social History  . Marital status: Single    Spouse name: N/A  . Number of children: 0  . 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 Smoking status: Never Smoker  . Smokeless tobacco: Never Used  . Alcohol use 0.0 oz/week     Comment: occasionally  . Drug use: No  . Sexual activity: Not on file   Other Topics Concern  . Not on file   Social History Narrative   Lives at home with her mother.   Right-handed.   1 cup caffeine daily.     PHYSICAL EXAM  Vitals:   02/28/17 0741  BP: (!) 104/38  Pulse: 68  Weight: 141 lb (64 kg)  Height: 5\' 4"  (1.626 m)   Body mass index is 24.2 kg/m.  Generalized: Well developed, in no acute distress  Head: normocephalic and atraumatic,. Oropharynx benign  Neck: Supple,  Musculoskeletal: No deformity   Neurological examination   Mentation: Alert oriented to time, place, history taking. Attention span and concentration appropriate. Recent and remote memory intact.  Follows all commands speech and language fluent.   Cranial nerve II-XII: Pupils were equal round reactive to light extraocular movements were full, visual field were full on confrontational test. Facial sensation and strength were normal. hearing was intact to finger rubbing  bilaterally. Uvula tongue midline. head turning and shoulder shrug were normal and symmetric.Tongue protrusion into cheek strength was normal. Motor: normal bulk and tone, full strength in the BUE, BLE, fine finger movements normal, no pronator drift. No focal weakness Sensory: normal and symmetric to light touch, In the upper and lower extremities  Coordination: finger-nose-finger, heel-to-shin bilaterally, no dysmetria Reflexes: Brachioradialis 2/2, biceps 2/2, triceps 2/2, patellar 2/2, Achilles 2/2, plantar responses were flexor bilaterally. Gait and Station: Rising up from seated position without assistance, normal stance,  moderate stride, good arm swing, smooth turning, able to perform tiptoe, and heel walking without difficulty. Tandem gait is steady  DIAGNOSTIC DATA (LABS, IMAGING, TESTING) - I reviewed patient records, labs, notes, testing and imaging myself where available.  Lab Results  Component Value Date   WBC 4.4 11/10/2015   HGB 11.1 11/10/2015   HCT 36.0 11/10/2015   MCV 90 11/10/2015   PLT 255 11/10/2015      Component Value Date/Time   NA 139 11/10/2015 1040   K 4.4 11/10/2015 1040   CL 102 11/10/2015 1040   CO2 21 11/10/2015 1040   GLUCOSE 86 11/10/2015 1040   GLUCOSE 97 06/01/2008 1510   BUN 11 11/10/2015 1040   CREATININE 0.77 11/10/2015 1040   CALCIUM 8.6 (L) 11/10/2015 1040  PROT 6.1 11/10/2015 1040   ALBUMIN 3.5 11/10/2015 1040   AST 15 11/10/2015 1040   ALT 8 11/10/2015 1040   ALKPHOS 53 11/10/2015 1040   BILITOT <0.2 11/10/2015 1040   GFRNONAA 104 11/10/2015 1040   GFRAA 120 11/10/2015 1040    Lab Results  Component Value Date   VITAMINB12 247 11/10/2015   Lab Results  Component Value Date   TSH 2.810 11/10/2015      ASSESSMENT AND PLAN  30 y.o. year old female  has a past medical history of Migraines and Syncopal episodes. here with To follow-up for her migraines and syncopal episodes. No recent syncopal episodes. She has 1-2 migraines  per month.    PLAN: Continue Nortriptyline 25 mg 2 tablets every night preventive medication does not need refills  Continue Maxalt acutely does not need refills Continue Zofran when necessary nausea will refill Avoid migraine triggers Follow-up in 8 months I spent 15 min  in total face to face time with the patient more than 50% of which was spent counseling and coordination of care, reviewing test results reviewing medications and discussing and reviewing the diagnosis of migraine, and avoiding migraine triggers. Patient to call for any syncopal episodes. Stay well hydrated  Nilda Riggs, [facility], [facility], APRN  [facility] [address] [place], [place] [address] [phone]

## 2017-02-28 ENCOUNTER — Encounter: Payer: Self-pay | Admitting: Nurse Practitioner

## 2017-02-28 ENCOUNTER — Ambulatory Visit (INDEPENDENT_AMBULATORY_CARE_PROVIDER_SITE_OTHER): Payer: Federal, State, Local not specified - PPO | Admitting: Nurse Practitioner

## 2017-02-28 VITALS — BP 104/38 | HR 68 | Ht 64.0 in | Wt 141.0 lb

## 2017-02-28 DIAGNOSIS — G43009 Migraine without aura, not intractable, without status migrainosus: Secondary | ICD-10-CM

## 2017-02-28 NOTE — Progress Notes (Signed)
I have reviewed and agreed above plan. 

## 2017-02-28 NOTE — Patient Instructions (Signed)
Continue Nortriptyline 25 mg 2 tablets every night preventive medication Continue Maxalt acutely Continue Zofran when necessary nausea Avoid migraine triggers Follow-up in 8 months

## 2017-06-15 DIAGNOSIS — S46002A Unspecified injury of muscle(s) and tendon(s) of the rotator cuff of left shoulder, initial encounter: Secondary | ICD-10-CM | POA: Diagnosis not present

## 2017-07-02 DIAGNOSIS — M542 Cervicalgia: Secondary | ICD-10-CM | POA: Diagnosis not present

## 2017-07-02 DIAGNOSIS — M25512 Pain in left shoulder: Secondary | ICD-10-CM | POA: Diagnosis not present

## 2017-07-10 DIAGNOSIS — M7542 Impingement syndrome of left shoulder: Secondary | ICD-10-CM | POA: Diagnosis not present

## 2017-07-10 DIAGNOSIS — M25512 Pain in left shoulder: Secondary | ICD-10-CM | POA: Diagnosis not present

## 2017-07-16 DIAGNOSIS — M7542 Impingement syndrome of left shoulder: Secondary | ICD-10-CM | POA: Diagnosis not present

## 2017-07-16 DIAGNOSIS — M25512 Pain in left shoulder: Secondary | ICD-10-CM | POA: Diagnosis not present

## 2017-07-19 DIAGNOSIS — M25512 Pain in left shoulder: Secondary | ICD-10-CM | POA: Diagnosis not present

## 2017-07-19 DIAGNOSIS — M7542 Impingement syndrome of left shoulder: Secondary | ICD-10-CM | POA: Diagnosis not present

## 2017-07-25 DIAGNOSIS — M25512 Pain in left shoulder: Secondary | ICD-10-CM | POA: Diagnosis not present

## 2017-07-25 DIAGNOSIS — M7542 Impingement syndrome of left shoulder: Secondary | ICD-10-CM | POA: Diagnosis not present

## 2017-07-30 DIAGNOSIS — K080 Exfoliation of teeth due to systemic causes: Secondary | ICD-10-CM | POA: Diagnosis not present

## 2017-07-30 DIAGNOSIS — M25512 Pain in left shoulder: Secondary | ICD-10-CM | POA: Diagnosis not present

## 2017-07-30 DIAGNOSIS — M7542 Impingement syndrome of left shoulder: Secondary | ICD-10-CM | POA: Diagnosis not present

## 2017-08-05 DIAGNOSIS — Z01419 Encounter for gynecological examination (general) (routine) without abnormal findings: Secondary | ICD-10-CM | POA: Diagnosis not present

## 2017-08-05 DIAGNOSIS — N760 Acute vaginitis: Secondary | ICD-10-CM | POA: Diagnosis not present

## 2017-08-05 DIAGNOSIS — Z113 Encounter for screening for infections with a predominantly sexual mode of transmission: Secondary | ICD-10-CM | POA: Diagnosis not present

## 2017-08-08 DIAGNOSIS — M7542 Impingement syndrome of left shoulder: Secondary | ICD-10-CM | POA: Diagnosis not present

## 2017-08-08 DIAGNOSIS — M25512 Pain in left shoulder: Secondary | ICD-10-CM | POA: Diagnosis not present

## 2017-08-12 ENCOUNTER — Ambulatory Visit (INDEPENDENT_AMBULATORY_CARE_PROVIDER_SITE_OTHER): Payer: Federal, State, Local not specified - PPO

## 2017-08-12 ENCOUNTER — Encounter (HOSPITAL_COMMUNITY): Payer: Self-pay | Admitting: Emergency Medicine

## 2017-08-12 ENCOUNTER — Ambulatory Visit (HOSPITAL_COMMUNITY)
Admission: EM | Admit: 2017-08-12 | Discharge: 2017-08-12 | Disposition: A | Discharge status: Home / Self Care | Payer: Federal, State, Local not specified - PPO | Attending: Family Medicine | Admitting: Family Medicine

## 2017-08-12 ENCOUNTER — Other Ambulatory Visit: Payer: Self-pay

## 2017-08-12 DIAGNOSIS — R1013 Epigastric pain: Secondary | ICD-10-CM | POA: Diagnosis not present

## 2017-08-12 DIAGNOSIS — Z3202 Encounter for pregnancy test, result negative: Secondary | ICD-10-CM | POA: Diagnosis not present

## 2017-08-12 DIAGNOSIS — R1084 Generalized abdominal pain: Secondary | ICD-10-CM

## 2017-08-12 DIAGNOSIS — R1031 Right lower quadrant pain: Secondary | ICD-10-CM | POA: Diagnosis not present

## 2017-08-12 DIAGNOSIS — R1032 Left lower quadrant pain: Secondary | ICD-10-CM | POA: Diagnosis not present

## 2017-08-12 LAB — POCT URINALYSIS DIP (DEVICE)
BILIRUBIN URINE: NEGATIVE
Glucose, UA: 100 mg/dL — AB
HGB URINE DIPSTICK: NEGATIVE
Ketones, ur: NEGATIVE mg/dL
Leukocytes, UA: NEGATIVE
NITRITE: NEGATIVE
PH: 6 (ref 5.0–8.0)
Protein, ur: NEGATIVE mg/dL
Specific Gravity, Urine: 1.025 (ref 1.005–1.030)
Urobilinogen, UA: 0.2 mg/dL (ref 0.0–1.0)

## 2017-08-12 LAB — POCT PREGNANCY, URINE: PREG TEST UR: NEGATIVE

## 2017-08-12 LAB — GLUCOSE, CAPILLARY: GLUCOSE-CAPILLARY: 76 mg/dL (ref 65–99)

## 2017-08-12 MED ORDER — GI COCKTAIL ~~LOC~~
30.0000 mL | Freq: Once | ORAL | Status: AC
  Administered 2017-08-12: 30 mL via ORAL

## 2017-08-12 MED ORDER — GI COCKTAIL ~~LOC~~
ORAL | Status: AC
  Filled 2017-08-12: qty 30

## 2017-08-12 MED ORDER — OMEPRAZOLE 20 MG PO CPDR: 20.0000 mg | DELAYED_RELEASE_CAPSULE | Freq: Every day | ORAL | 0 refills | Status: DC

## 2017-08-12 NOTE — ED Provider Notes (Signed)
[facility]    CSN: [id number] Arrival date & time: 08/12/17  1426     History   Chief Complaint Chief Complaint  Patient presents with  . Abdominal Pain    HPI Sabrina Robinson is a 32 y.o. female.   Sabrina Robinson presents with complaints of epigastric and left upper abdominal pain. States two days ago woke up with low abdominal pain and had two bm's. This pain has mildly improved but now with upper abdominal pain. Laying flat increases the pain. Has been eating and drinking, this does not increase pain. States as soon as she eats she feels she need to have a bm as if she has pelvic pressure. Still passing gas. Had a bm today which was normal. Denies any recent constipation. No fevers. Denies any previous similar. Denies any vaginal symptoms such as discharge itching or burning. Denies urinary symptoms. Took ibuprofen which did not help. Hx of migraines, no other contributing medical history. No previous abdominal surgeries.    ROS per HPI.      Past Medical History:  Diagnosis Date  . Migraines   . Syncopal episodes     Patient Active Problem List   Diagnosis Date Noted  . Passed out [facility](HCC) 11/10/2015  . DEPRESSIVE DISORDER, NOS 07/04/2006  . Migraine headache 07/04/2006  . BACK PAIN, LOW 07/04/2006    Past Surgical History:  Procedure Laterality Date  . NO PAST SURGERIES      OB History   None      Home Medications    Prior to Admission medications   Medication Sig Start Date End Date Taking? Authorizing Provider  norethindrone-ethinyl estradiol (MICROGESTIN,JUNEL,LOESTRIN) 1-20 MG-MCG tablet Take 1 tablet by mouth daily.    [provider]  nortriptyline (PAMELOR) 25 MG capsule Take 2 capsules (50 mg total) by mouth at bedtime. 07/19/16   Levert FeinsteinYan, Yijun, MD  omeprazole (PRILOSEC) 20 MG capsule Take 1 capsule (20 mg total) by mouth daily. 08/12/17   Georgetta HaberBurky, Natalie B, NP  ondansetron (ZOFRAN ODT) 4 MG disintegrating tablet Take 1 tablet (4 mg total) by  mouth every 8 (eight) hours as needed for nausea or vomiting. 11/10/15   Levert FeinsteinYan, Yijun, MD  rizatriptan (MAXALT-MLT) 10 MG disintegrating tablet Take 1 tablet (10 mg total) by mouth as needed. May repeat in 2 hours if needed 07/19/16   Levert FeinsteinYan, Yijun, MD    Family History Family History  Problem Relation Age of Onset  . Hypertension Mother     Social History Social History   Tobacco Use  . Smoking status: Never Smoker  . Smokeless tobacco: Never Used  Substance Use Topics  . Alcohol use: Yes    Alcohol/week: 0.0 oz    Comment: occasionally  . Drug use: No     Allergies   Patient has no known allergies.   Review of Systems Review of Systems   Physical Exam Triage Vital Signs ED Triage Vitals  Enc Vitals Group     BP 08/12/17 1457 112/76     Pulse Rate 08/12/17 1457 80     Resp 08/12/17 1457 18     Temp 08/12/17 1457 97.9 F (36.6 C)     Temp Source 08/12/17 1457 Oral     SpO2 08/12/17 1457 100 %     Weight --      Height --      Head Circumference --      Peak Flow --      Pain Score 08/12/17 1455 7  Pain Loc --      Pain Edu? --      Excl. in GC? --    No data found.  Updated Vital Signs BP 112/76 (BP Location: Left Arm)   Pulse 80   Temp 97.9 F (36.6 C) (Oral)   Resp 18   SpO2 100%   Visual Acuity Right Eye Distance:   Left Eye Distance:   Bilateral Distance:    Right Eye Near:   Left Eye Near:    Bilateral Near:     Physical Exam  Constitutional: She is oriented to person, place, and time. She appears well-developed and well-nourished. No distress.  Cardiovascular: Normal rate, regular rhythm and normal heart sounds.  Pulmonary/Chest: Effort normal and breath sounds normal.  Abdominal: Soft. Bowel sounds are normal. There is no hepatosplenomegaly, splenomegaly or hepatomegaly. There is tenderness in the right upper quadrant, right lower quadrant, epigastric area, suprapubic area and left upper quadrant. There is no rigidity, no rebound, no  guarding, no CVA tenderness, no tenderness at McBurney's point and negative Murphy's sign. No hernia.  Neurological: She is alert and oriented to person, place, and time.  Skin: Skin is warm and dry.     UC Treatments / Results  Labs (all labs ordered are listed, but only abnormal results are displayed) Labs Reviewed  POCT URINALYSIS DIP (DEVICE) - Abnormal; Notable for the following components:      Result Value   Glucose, UA 100 (*)    All other components within normal limits  GLUCOSE, CAPILLARY  POCT PREGNANCY, URINE    EKG None Radiology Dg Abd 2 Views  Result Date: 08/12/2017 CLINICAL DATA:  Abdominal pain, left lower and right lower quadrant pain EXAM: ABDOMEN - 2 VIEW COMPARISON:  None. FINDINGS: Diaphragm not completely included on upright view which limits evaluation for free air. Nonobstructed gas pattern. Oval opacity slightly to the left of midline over the upper sacrum, possibly corresponding to a navel piercing as indicated in the study nodes. Focal calcification or sclerotic lesion in the right iliac bone. Punctate calcification in the right pelvis. IMPRESSION: 1. Nonobstructed gas pattern 2. Possible punctate calcification in the right hemipelvis. CT KUB follow-up if symptomatology suggests stone disease 3. Oval opacity slightly to the left of midline may correspond to history of navel ring. Electronically Signed   By: Jasmine Pang M.D.   On: 08/12/2017 16:08    Procedures Procedures (including critical care time)  Medications Ordered in UC Medications  gi cocktail (Maalox,Lidocaine,Donnatal) (30 mLs Oral Given 08/12/17 1552)     Initial Impression / Assessment and Plan / UC Course  I have reviewed the triage vital signs and the nursing notes.  Pertinent labs & imaging results that were available during my care of the patient were reviewed by me and considered in my medical decision making (see chart for details).     Non toxic in appearance. Afebrile. Without  tachypnea or tachycardia. Gi cocktail provided in clinic. Without significant pain relief. Abdominal xray without changes to gas patterns. Possible stone to right side noted per radiology. Without cva tenderness, severe pain, blood in urine. Discussed with patient, differentials to include gas, gerd, stone, pancreatitis, cholecystitis. Patient agreeable to follow up with PCP for recheck. Will trial omeprazole. Push fluids, ibuprofen for pain. Return precautions provided and discussed as may need further imaging to further evaluate source of pain. Patient verbalized understanding and agreeable to plan.    Final Clinical Impressions(s) / UC Diagnoses   Final diagnoses:  Epigastric pain  Generalized abdominal pain    ED Discharge Orders        Ordered    omeprazole (PRILOSEC) 20 MG capsule  Daily     08/12/17 1624       Controlled Substance Prescriptions [place] Controlled Substance Registry consulted? Not Applicable   Georgetta Haber, NP 08/12/17 [phone]

## 2017-08-12 NOTE — Discharge Instructions (Signed)
Ibuprofen for pain control Low fat diet.  Increase water intake. Please follow up with your primary care doctor for recheck of persistent symptoms. If develop increased pain, fevers or otherwise worsening please go to the Er as you may need additional evaluation.

## 2017-08-12 NOTE — ED Triage Notes (Signed)
Complains of abdominal pain on Saturday morning.  Denies diarrhea.  Denies vomiting.  Initially had low abdominal pain.  Over time left flank and left back pain started.  Patient reports last bm was this morning and normal for patient

## 2017-10-10 ENCOUNTER — Ambulatory Visit (INDEPENDENT_AMBULATORY_CARE_PROVIDER_SITE_OTHER): Payer: Federal, State, Local not specified - PPO

## 2017-10-10 ENCOUNTER — Encounter (HOSPITAL_COMMUNITY): Payer: Self-pay | Admitting: Emergency Medicine

## 2017-10-10 ENCOUNTER — Ambulatory Visit (HOSPITAL_COMMUNITY)
Admission: EM | Admit: 2017-10-10 | Discharge: 2017-10-10 | Disposition: A | Discharge status: Home / Self Care | Payer: Federal, State, Local not specified - PPO | Attending: Family Medicine | Admitting: Family Medicine

## 2017-10-10 DIAGNOSIS — M25572 Pain in left ankle and joints of left foot: Secondary | ICD-10-CM | POA: Diagnosis not present

## 2017-10-10 DIAGNOSIS — S92155A Nondisplaced avulsion fracture (chip fracture) of left talus, initial encounter for closed fracture: Secondary | ICD-10-CM

## 2017-10-10 DIAGNOSIS — S99922A Unspecified injury of left foot, initial encounter: Secondary | ICD-10-CM | POA: Diagnosis not present

## 2017-10-10 DIAGNOSIS — S99912A Unspecified injury of left ankle, initial encounter: Secondary | ICD-10-CM | POA: Diagnosis not present

## 2017-10-10 DIAGNOSIS — M79672 Pain in left foot: Secondary | ICD-10-CM | POA: Diagnosis not present

## 2017-10-10 MED ORDER — IBUPROFEN 800 MG PO TABS
ORAL_TABLET | ORAL | Status: AC
Start: 2017-10-10 — End: 2017-10-10
  Filled 2017-10-10: qty 1

## 2017-10-10 MED ORDER — IBUPROFEN 800 MG PO TABS
800.0000 mg | ORAL_TABLET | Freq: Once | ORAL | Status: AC
  Administered 2017-10-10: 800 mg via ORAL

## 2017-10-10 MED ORDER — IBUPROFEN 800 MG PO TABS: 800.0000 mg | ORAL_TABLET | Freq: Three times a day (TID) | ORAL | 0 refills | Status: AC

## 2017-10-10 NOTE — Discharge Instructions (Signed)
Ice, elevation, weight bearing as tolerated with use of brace we have provided at all times except when bathing. Ibuprofen for pain control Please follow up with orthopedics for definitive treatment.       Dg Foot Complete Left  Result Date: 10/10/2017 CLINICAL DATA:  Twisted ankle.  Anterior midline foot pain EXAM: LEFT FOOT - COMPLETE 3+ VIEW COMPARISON:  Ankle series today FINDINGS: Avulsion fracture off the anterior distal left talus, presumably acute. No additional acute bony abnormality. No subluxation or dislocation. IMPRESSION: Small bone fragment noted along the anterior distal talus, likely avulsed fragment. Electronically Signed   By: Charlett NoseKevin  Dover M.D.   On: 10/10/2017 11:09

## 2017-10-10 NOTE — ED Provider Notes (Signed)
[facility]    CSN: [id number] Arrival date & time: 10/10/17  1004     History   Chief Complaint Chief Complaint  Patient presents with  . Foot Pain    HPI Leandro ReasonerSelina L Robinson is a 32 y.o. female.   Kara DiesSelina presents with complaints of left foot and ankle pain after landing on her foot wrong this morning while doing box jumps at the gym. Pain with weight bearing. No previous foot/ankle injury. Pain 8/10. Has not taken any medications for pain. Some tingling to her toes. No numbness. Minimal swelling. Hx of migraines and syncope.  Not taking any medications regularly.   ROS per HPI.      Past Medical History:  Diagnosis Date  . Migraines   . Syncopal episodes     Patient Active Problem List   Diagnosis Date Noted  . Passed out [facility](HCC) 11/10/2015  . DEPRESSIVE DISORDER, NOS 07/04/2006  . Migraine headache 07/04/2006  . BACK PAIN, LOW 07/04/2006    Past Surgical History:  Procedure Laterality Date  . NO PAST SURGERIES      OB History   None      Home Medications    Prior to Admission medications   Medication Sig Start Date End Date Taking? Authorizing Provider  ibuprofen (ADVIL,MOTRIN) 800 MG tablet Take 1 tablet (800 mg total) by mouth 3 (three) times daily. 10/10/17   Georgetta HaberBurky, Natalie B, NP  norethindrone-ethinyl estradiol (MICROGESTIN,JUNEL,LOESTRIN) 1-20 MG-MCG tablet Take 1 tablet by mouth daily.    [provider]  nortriptyline (PAMELOR) 25 MG capsule Take 2 capsules (50 mg total) by mouth at bedtime. 07/19/16   Levert FeinsteinYan, Yijun, MD  omeprazole (PRILOSEC) 20 MG capsule Take 1 capsule (20 mg total) by mouth daily. 08/12/17   Georgetta HaberBurky, Natalie B, NP  ondansetron (ZOFRAN ODT) 4 MG disintegrating tablet Take 1 tablet (4 mg total) by mouth every 8 (eight) hours as needed for nausea or vomiting. 11/10/15   Levert FeinsteinYan, Yijun, MD  rizatriptan (MAXALT-MLT) 10 MG disintegrating tablet Take 1 tablet (10 mg total) by mouth as needed. May repeat in 2 hours if needed 07/19/16    Levert FeinsteinYan, Yijun, MD    Family History Family History  Problem Relation Age of Onset  . Hypertension Mother     Social History Social History   Tobacco Use  . Smoking status: Never Smoker  . Smokeless tobacco: Never Used  Substance Use Topics  . Alcohol use: Yes    Alcohol/week: 0.0 oz    Comment: occasionally  . Drug use: No     Allergies   Patient has no known allergies.   Review of Systems Review of Systems   Physical Exam Triage Vital Signs ED Triage Vitals [10/10/17 1029]  Enc Vitals Group     BP 112/65     Pulse Rate 70     Resp 16     Temp 98.8 F (37.1 C)     Temp Source Oral     SpO2 100 %     Weight      Height      Head Circumference      Peak Flow      Pain Score      Pain Loc      Pain Edu?      Excl. in GC?    No data found.  Updated Vital Signs BP 112/65 (BP Location: Right Arm)   Pulse 70   Temp 98.8 F (37.1 C) (Oral)   Resp  16   LMP 10/10/2017 (Exact Date)   SpO2 100%   Visual Acuity Right Eye Distance:   Left Eye Distance:   Bilateral Distance:    Right Eye Near:   Left Eye Near:    Bilateral Near:     Physical Exam  Constitutional: She is oriented to person, place, and time. She appears well-developed and well-nourished. No distress.  Cardiovascular: Normal rate, regular rhythm and normal heart sounds.  Pulmonary/Chest: Effort normal and breath sounds normal.  Musculoskeletal:       Left ankle: She exhibits decreased range of motion. She exhibits no swelling, no ecchymosis, no deformity, no laceration and normal pulse. Tenderness. Achilles tendon normal.       Left foot: There is decreased range of motion, tenderness and bony tenderness. There is no swelling, normal capillary refill, no crepitus, no deformity and no laceration.       Feet:  Left dorsal foot tenderness on palpation; soft tissue tenderness surrounding left lateral malleolus; pain with all ROM to ankle and pain with movement of toes although light active  movement present; cap refill < 2 seconds; strong pedal pulse   Neurological: She is alert and oriented to person, place, and time.  Skin: Skin is warm and dry.     UC Treatments / Results  Labs (all labs ordered are listed, but only abnormal results are displayed) Labs Reviewed - No data to display  EKG None  Radiology Dg Ankle Complete Left  Result Date: 10/10/2017 CLINICAL DATA:  Twisting injury with ankle pain, initial encounter EXAM: LEFT ANKLE COMPLETE - 3+ VIEW COMPARISON:  None. FINDINGS: No acute fracture or dislocation is noted. A well corticated bony density is noted adjacent to the distal aspect of the talus dorsally likely related to prior avulsion. No overlying soft tissue abnormality is seen. IMPRESSION: No acute abnormality noted. Changes suggestive of prior trauma are seen. Electronically Signed   By: Alcide Clever M.D.   On: 10/10/2017 11:06   Dg Foot Complete Left  Result Date: 10/10/2017 CLINICAL DATA:  Twisted ankle.  Anterior midline foot pain EXAM: LEFT FOOT - COMPLETE 3+ VIEW COMPARISON:  Ankle series today FINDINGS: Avulsion fracture off the anterior distal left talus, presumably acute. No additional acute bony abnormality. No subluxation or dislocation. IMPRESSION: Small bone fragment noted along the anterior distal talus, likely avulsed fragment. Electronically Signed   By: Charlett Nose M.D.   On: 10/10/2017 11:09    Procedures Procedures (including critical care time)  Medications Ordered in UC Medications  ibuprofen (ADVIL,MOTRIN) tablet 800 mg (800 mg Oral Given 10/10/17 1050)    Initial Impression / Assessment and Plan / UC Course  I have reviewed the triage vital signs and the nursing notes.  Pertinent labs & imaging results that were available during my care of the patient were reviewed by me and considered in my medical decision making (see chart for details).     Concern for talus avulsion fracture to anterior distal talus. Cam walker placed with  weight bearing as tolerated. Ice, elevation, ibuprofen for pain control. Follow up with ortho for definitive treatment. Patient verbalized understanding and agreeable to plan.    Final Clinical Impressions(s) / UC Diagnoses   Final diagnoses:  Closed nondisplaced avulsion fracture of left talus, initial encounter     Discharge Instructions     Ice, elevation, weight bearing as tolerated with use of brace we have provided at all times except when bathing. Ibuprofen for pain control Please follow up  with orthopedics for definitive treatment.       Dg Foot Complete Left  Result Date: 10/10/2017 CLINICAL DATA:  Twisted ankle.  Anterior midline foot pain EXAM: LEFT FOOT - COMPLETE 3+ VIEW COMPARISON:  Ankle series today FINDINGS: Avulsion fracture off the anterior distal left talus, presumably acute. No additional acute bony abnormality. No subluxation or dislocation. IMPRESSION: Small bone fragment noted along the anterior distal talus, likely avulsed fragment. Electronically Signed   By: Charlett Nose M.D.   On: 10/10/2017 11:09      ED Prescriptions    Medication Sig Dispense Auth. Provider   ibuprofen (ADVIL,MOTRIN) 800 MG tablet Take 1 tablet (800 mg total) by mouth 3 (three) times daily. 21 tablet Georgetta Haber, NP     Controlled Substance Prescriptions [place] Controlled Substance Registry consulted? Not Applicable   Georgetta Haber, NP 10/10/17 1127

## 2017-10-10 NOTE — ED Triage Notes (Signed)
Pt states she was doing box jumps at the gym and missed and twisted her L foot, c/o L foot pain, ankle pain.

## 2017-10-11 DIAGNOSIS — M25572 Pain in left ankle and joints of left foot: Secondary | ICD-10-CM | POA: Diagnosis not present

## 2017-10-29 NOTE — Progress Notes (Deleted)
[facility]  PATIENT: Sabrina Robinson DOB: 1985-11-20   REASON FOR VISIT: Follow-up for migraines, episodes of passing out HISTORY FROM: Patient    HISTORY OF PRESENT ILLNESS:Sabrina Robinson  is a 32 years old right-handed female,seen in refer by  her primary care doctor Consuella Lose Gfor evaluation of chronic migraine headache in November 10 2015  She reported history of migraine headaches since middle school, her typical migraine are right lateralized severe pounding headache with associated light noise sensitivity, nauseous, spreceded by a gradual building up of pressure headache at right temporoparietal region, since2016 she has increased frequency of headache, about couple times a week, to currently daily basis now since May 2017,  She was evaluated by headache specialist Dr. Neale Burly in 2015, has tried preventive medication Topamax up to 100 mg twice a day, she could not tolerate it due to heart flutter, dizziness, there was no noticeable improvement in her headache after 6 months. She also received trigger point injection without significant improvement,  For abortive treatment, She has tried Imitrex since May 2017, she tried 3 times, it works well for her headaches take away her headache in 2 hours, but she had choking sensation, it is unbearable for her,  Trigger for her migraines are stress, sleep deprivation, weather change, hungry, dehydration, strong smells, bright light, Because of frequent headaches, she has been taking BC powder at least 4 times a week, up to 3 packs each day, frequent almost daily Excedrin Migraine use, for over one year, also tried different over-the-counter medication aspirin Tylenol ibuprofen Aleve without helping her headaches Today she is also very concerned about her passing out episode, she had multiple episodes since 2002, each episode is similar, she described to episode happened while taking a hot shower, she had sudden onset dizziness,  wooshing sound in her ear, blurry vision, and transient loss of consciousness quickly recovered after she landed on the floor, there was no seizure-like activity noticed,  some of the episode triggered by painful stimulation, such as broken her toes.  UPDATE July 19 2016:YY I have personally reviewed MRI of the brain with without contrast in July 2017, no acute intracranial abnormality. EEG was normal in July 2017 Laboratory in July 2017, normal CMP, CBC, TSH, B12  She has no more passing out episode, she continue have migraine 2-3 times each months, still has to take Maxalt 2-3 time each month, about 1 hour to abort the headache, but she feels fatigued afterwards.  UPDATE 10/25/2018[name], 32 year old female returns for follow-up for migraines and passing out episodes. She is having approximately 2 migraines per month usually around her cycle. She denies any syncopal episodes since last seen. Maxalt works acutely but she is still fatigued afterwards. Her migraine triggers are bright lights,  strong smells, lack of sleep dehydration and weather changes. She returns for reevaluation   REVIEW OF SYSTEMS: Full 14 system review of systems performed and notable only for those listed, all others are neg:  Constitutional: neg  Cardiovascular: neg Ear/Nose/Throat: neg  Skin: neg Eyes: neg Respiratory: neg Gastroitestinal: neg  Hematology/Lymphatic: neg  Endocrine: neg Musculoskeletal:neg Allergy/Immunology: neg Neurological: Migraine headaches Psychiatric: neg Sleep : neg   ALLERGIES: No Known Allergies  HOME MEDICATIONS: Outpatient Medications Prior to Visit  Medication Sig Dispense Refill  . ibuprofen (ADVIL,MOTRIN) 800 MG tablet Take 1 tablet (800 mg total) by mouth 3 (three) times daily. 21 tablet 0  . norethindrone-ethinyl estradiol (MICROGESTIN,JUNEL,LOESTRIN) 1-20 MG-MCG tablet Take 1 tablet by mouth  daily.    . nortriptyline (PAMELOR) 25 MG capsule Take 2 capsules (50 mg  total) by mouth at bedtime. 180 capsule 4  . omeprazole (PRILOSEC) 20 MG capsule Take 1 capsule (20 mg total) by mouth daily. 30 capsule 0  . ondansetron (ZOFRAN ODT) 4 MG disintegrating tablet Take 1 tablet (4 mg total) by mouth every 8 (eight) hours as needed for nausea or vomiting. 20 tablet 3  . rizatriptan (MAXALT-MLT) 10 MG disintegrating tablet Take 1 tablet (10 mg total) by mouth as needed. May repeat in 2 hours if needed 15 tablet 11   No facility-administered medications prior to visit.     PAST MEDICAL HISTORY: Past Medical History:  Diagnosis Date  . Migraines   . Syncopal episodes     PAST SURGICAL HISTORY: Past Surgical History:  Procedure Laterality Date  . NO PAST SURGERIES      FAMILY HISTORY: Family History  Problem Relation Age of Onset  . Hypertension Mother     SOCIAL HISTORY: Social History   Socioeconomic History  . Marital status: Single    Spouse name: Not on file  . Number of children: 0  . Years of education: Bachelors  . Highest education level: Not on file  Occupational History  . Occupation: Museum/gallery curator for [employer]  . Financial resource strain: Not on file  . Food insecurity:    Worry: Not on file    Inability: Not on file  . Transportation needs:    Medical: Not on file    Non-medical: Not on file  Tobacco Use  . Smoking status: Never Smoker  . Smokeless tobacco: Never Used  Substance and Sexual Activity  . Alcohol use: Yes    Alcohol/week: 0.0 oz    Comment: occasionally  . Drug use: No  . Sexual activity: Not on file  Lifestyle  . Physical activity:    Days per week: Not on file    Minutes per session: Not on file  . Stress: Not on file  Relationships  . Social connections:    Talks on phone: Not on file    Gets together: Not on file    Attends religious service: Not on file    Active member of club or organization: Not on file    Attends meetings of clubs or organizations: Not on file    Relationship  status: Not on file  . Intimate partner violence:    Fear of current or ex partner: Not on file    Emotionally abused: Not on file    Physically abused: Not on file    Forced sexual activity: Not on file  Other Topics Concern  . Not on file  Social History Narrative   Lives at home with her mother.   Right-handed.   1 cup caffeine daily.     PHYSICAL EXAM  There were no vitals filed for this visit. There is no height or weight on file to calculate BMI.  Generalized: Well developed, in no acute distress  Head: normocephalic and atraumatic,. Oropharynx benign  Neck: Supple,  Musculoskeletal: No deformity   Neurological examination   Mentation: Alert oriented to time, place, history taking. Attention span and concentration appropriate. Recent and remote memory intact.  Follows all commands speech and language fluent.   Cranial nerve II-XII: Pupils were equal round reactive to light extraocular movements were full, visual field were full on confrontational test. Facial sensation and strength were normal. hearing was intact to finger rubbing  bilaterally. Uvula tongue midline. head turning and shoulder shrug were normal and symmetric.Tongue protrusion into cheek strength was normal. Motor: normal bulk and tone, full strength in the BUE, BLE, fine finger movements normal, no pronator drift. No focal weakness Sensory: normal and symmetric to light touch, In the upper and lower extremities  Coordination: finger-nose-finger, heel-to-shin bilaterally, no dysmetria Reflexes: Brachioradialis 2/2, biceps 2/2, triceps 2/2, patellar 2/2, Achilles 2/2, plantar responses were flexor bilaterally. Gait and Station: Rising up from seated position without assistance, normal stance,  moderate stride, good arm swing, smooth turning, able to perform tiptoe, and heel walking without difficulty. Tandem gait is steady  DIAGNOSTIC DATA (LABS, IMAGING, TESTING) - I reviewed patient records, labs, notes,  testing and imaging myself where available.  Lab Results  Component Value Date   WBC 4.4 11/10/2015   HGB 11.1 11/10/2015   HCT 36.0 11/10/2015   MCV 90 11/10/2015   PLT 255 11/10/2015      Component Value Date/Time   NA 139 11/10/2015 1040   K 4.4 11/10/2015 1040   CL 102 11/10/2015 1040   CO2 21 11/10/2015 1040   GLUCOSE 86 11/10/2015 1040   GLUCOSE 97 06/01/2008 1510   BUN 11 11/10/2015 1040   CREATININE 0.77 11/10/2015 1040   CALCIUM 8.6 (L) 11/10/2015 1040   PROT 6.1 11/10/2015 1040   ALBUMIN 3.5 11/10/2015 1040   AST 15 11/10/2015 1040   ALT 8 11/10/2015 1040   ALKPHOS 53 11/10/2015 1040   BILITOT <0.2 11/10/2015 1040   GFRNONAA 104 11/10/2015 1040   GFRAA 120 11/10/2015 1040    Lab Results  Component Value Date   VITAMINB12 247 11/10/2015   Lab Results  Component Value Date   TSH 2.810 11/10/2015      ASSESSMENT AND PLAN  32 y.o. year old female  has a past medical history of Migraines and Syncopal episodes. here with To follow-up for her migraines and syncopal episodes. No recent syncopal episodes. She has 1-2 migraines per month.    PLAN: Continue Nortriptyline 25 mg 2 tablets every night preventive medication does not need refills  Continue Maxalt acutely does not need refills Continue Zofran when necessary nausea will refill Avoid migraine triggers Follow-up in 8 months I spent 15 min  in total face to face time with the patient more than 50% of which was spent counseling and coordination of care, reviewing test results reviewing medications and discussing and reviewing the diagnosis of migraine, and avoiding migraine triggers. Patient to call for any syncopal episodes. Stay well hydrated  Nilda RiggsNancy Carolyn Martin, W.J[facility] Mangold Memorial HospitalGNP, [facility] HospitalBC, APRN  [facility] [address], [address] [place], [place] [address] [phone]

## 2017-10-30 ENCOUNTER — Ambulatory Visit: Payer: Federal, State, Local not specified - PPO | Admitting: Nurse Practitioner

## 2017-10-31 ENCOUNTER — Encounter: Payer: Self-pay | Admitting: Nurse Practitioner

## 2017-11-26 DIAGNOSIS — M25572 Pain in left ankle and joints of left foot: Secondary | ICD-10-CM | POA: Diagnosis not present

## 2018-01-08 DIAGNOSIS — N760 Acute vaginitis: Secondary | ICD-10-CM | POA: Diagnosis not present

## 2018-01-21 DIAGNOSIS — M25572 Pain in left ankle and joints of left foot: Secondary | ICD-10-CM | POA: Diagnosis not present

## 2018-01-23 DIAGNOSIS — S92155D Nondisplaced avulsion fracture (chip fracture) of left talus, subsequent encounter for fracture with routine healing: Secondary | ICD-10-CM | POA: Diagnosis not present

## 2018-01-28 DIAGNOSIS — S92155D Nondisplaced avulsion fracture (chip fracture) of left talus, subsequent encounter for fracture with routine healing: Secondary | ICD-10-CM | POA: Diagnosis not present

## 2018-01-30 DIAGNOSIS — S92155D Nondisplaced avulsion fracture (chip fracture) of left talus, subsequent encounter for fracture with routine healing: Secondary | ICD-10-CM | POA: Diagnosis not present

## 2018-02-04 DIAGNOSIS — S92155D Nondisplaced avulsion fracture (chip fracture) of left talus, subsequent encounter for fracture with routine healing: Secondary | ICD-10-CM | POA: Diagnosis not present

## 2018-02-06 DIAGNOSIS — S92155D Nondisplaced avulsion fracture (chip fracture) of left talus, subsequent encounter for fracture with routine healing: Secondary | ICD-10-CM | POA: Diagnosis not present

## 2018-02-11 DIAGNOSIS — S92155D Nondisplaced avulsion fracture (chip fracture) of left talus, subsequent encounter for fracture with routine healing: Secondary | ICD-10-CM | POA: Diagnosis not present

## 2018-08-08 IMAGING — DX DG ABDOMEN 2V
2 series · 2 of 2 positions shown · non-contrast
Comparison: None.

CLINICAL DATA: Abdominal pain, left lower and right lower quadrant
pain

EXAM:
ABDOMEN - 2 VIEW

[abdomen erect]
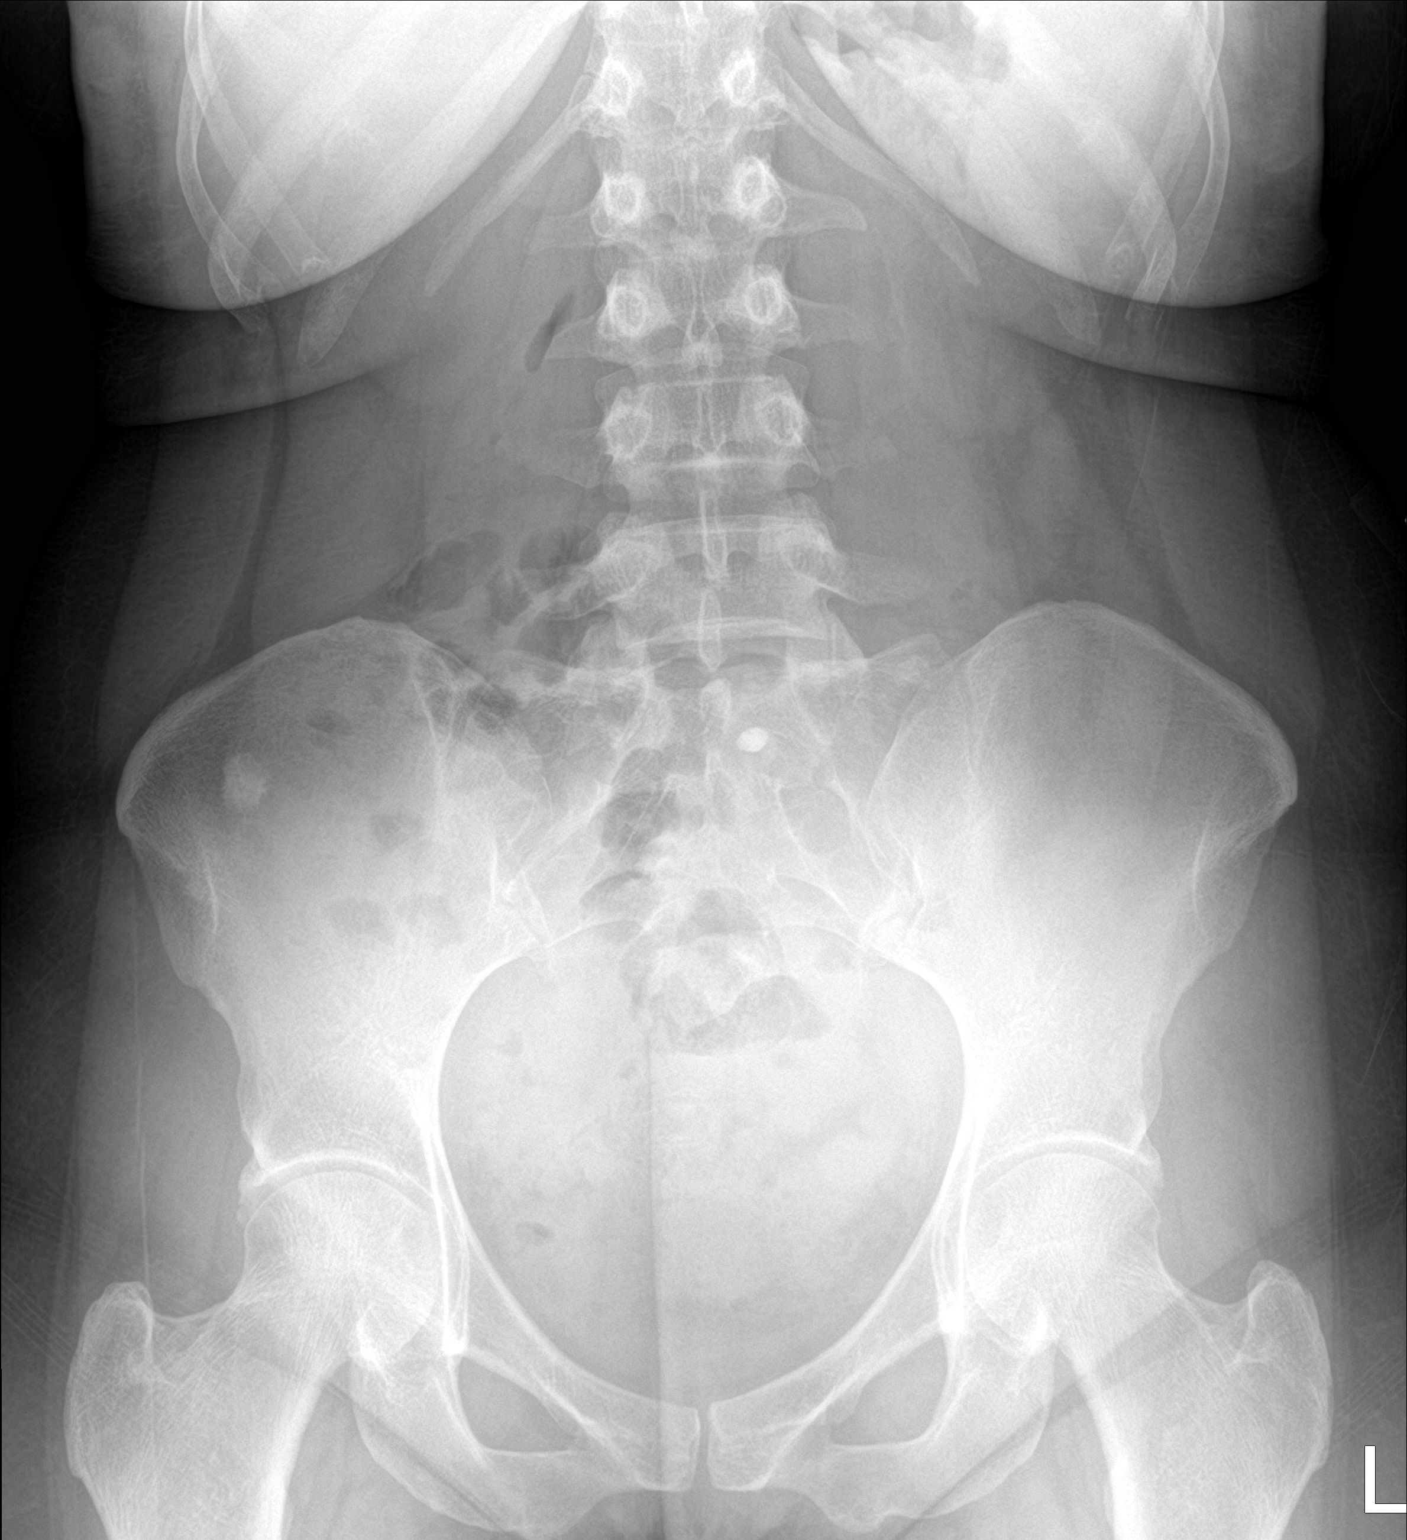

[abdomen supine]
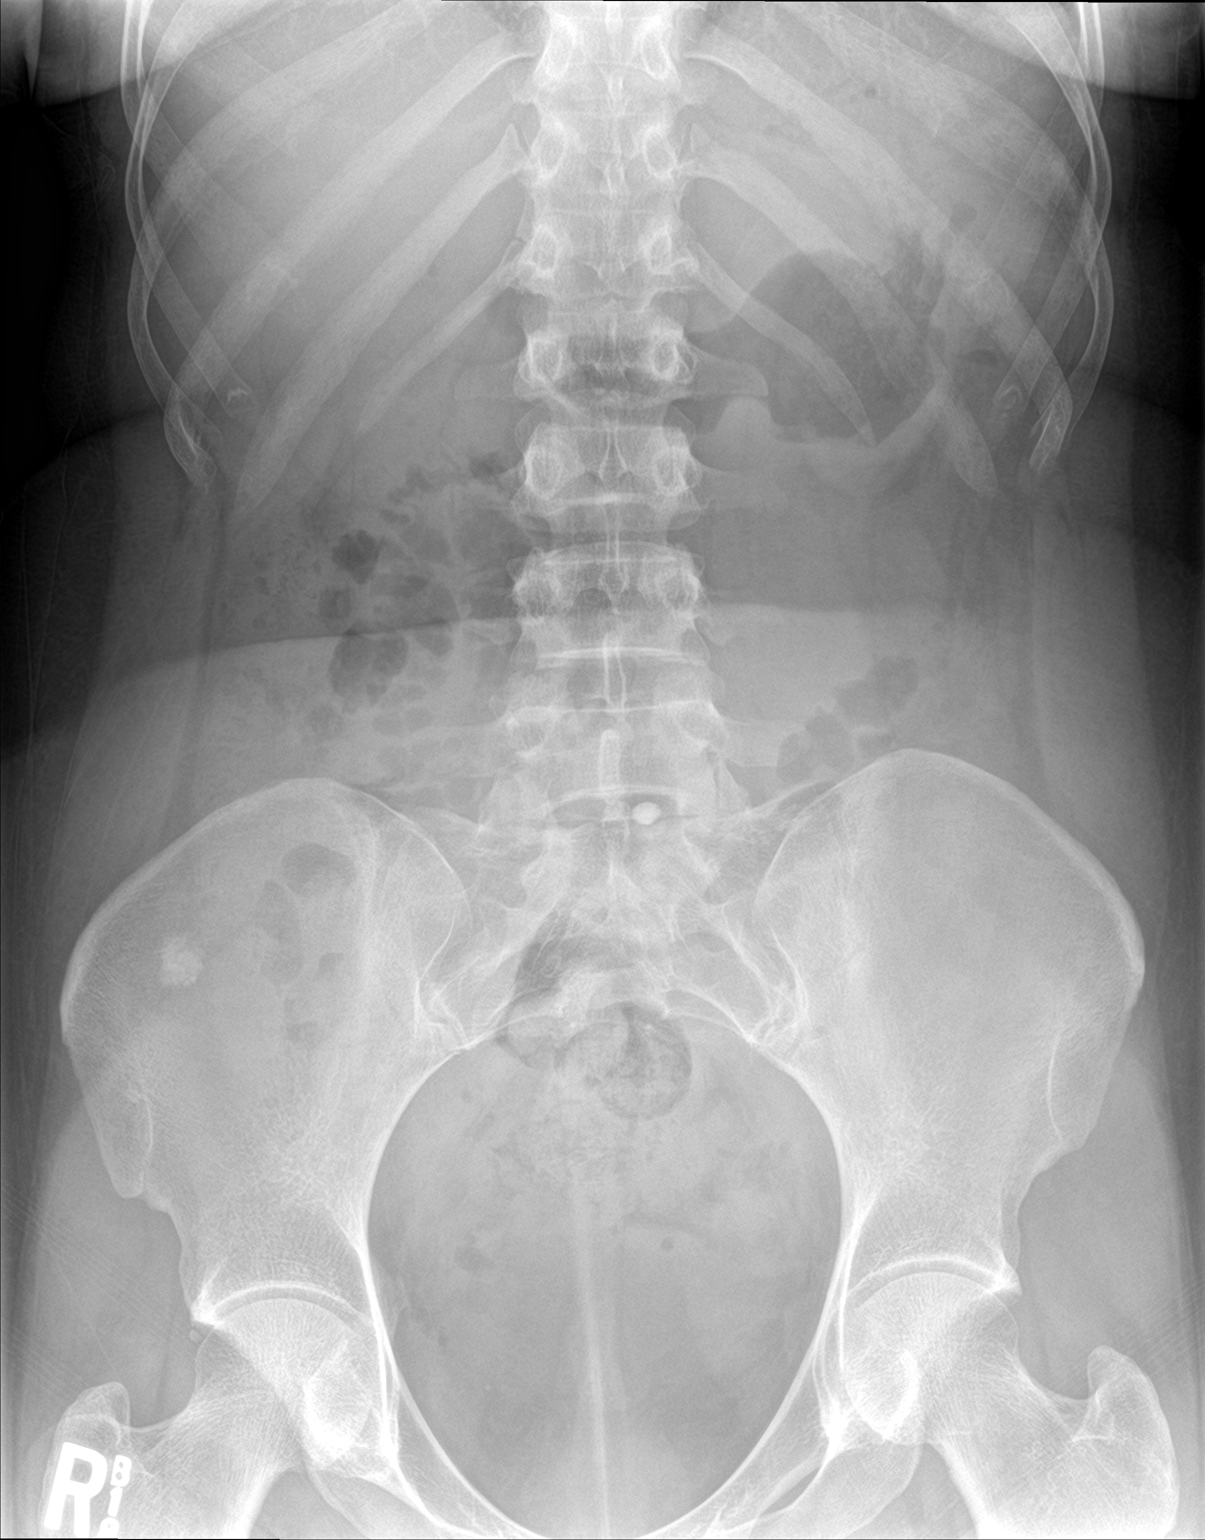

[2 of 2 positions shown; findings below may reference images not displayed]

FINDINGS: Diaphragm not completely included on upright view which limits
evaluation for free air. Nonobstructed gas pattern. Oval opacity
slightly to the left of midline over the upper sacrum, possibly
corresponding to a navel piercing as indicated in the study nodes.
Focal calcification or sclerotic lesion in the right iliac bone..
Punctate calcification in the right pelvis.
IMPRESSION: 1. Nonobstructed gas pattern
2. Possible punctate calcification in the right hemipelvis. CT KUB
follow-up if symptomatology suggests stone disease
3. Oval opacity slightly to the left of midline may correspond to
history of navel ring.

## 2018-08-13 DIAGNOSIS — F411 Generalized anxiety disorder: Secondary | ICD-10-CM | POA: Diagnosis not present

## 2018-08-20 DIAGNOSIS — F411 Generalized anxiety disorder: Secondary | ICD-10-CM | POA: Diagnosis not present

## 2018-08-26 DIAGNOSIS — F411 Generalized anxiety disorder: Secondary | ICD-10-CM | POA: Diagnosis not present

## 2018-09-02 DIAGNOSIS — F411 Generalized anxiety disorder: Secondary | ICD-10-CM | POA: Diagnosis not present

## 2018-09-10 DIAGNOSIS — G4700 Insomnia, unspecified: Secondary | ICD-10-CM | POA: Diagnosis not present

## 2018-09-10 DIAGNOSIS — F418 Other specified anxiety disorders: Secondary | ICD-10-CM | POA: Diagnosis not present

## 2018-09-10 DIAGNOSIS — F411 Generalized anxiety disorder: Secondary | ICD-10-CM | POA: Diagnosis not present

## 2018-09-13 DIAGNOSIS — F411 Generalized anxiety disorder: Secondary | ICD-10-CM | POA: Diagnosis not present

## 2018-09-17 DIAGNOSIS — F411 Generalized anxiety disorder: Secondary | ICD-10-CM | POA: Diagnosis not present

## 2018-09-23 DIAGNOSIS — F411 Generalized anxiety disorder: Secondary | ICD-10-CM | POA: Diagnosis not present

## 2018-09-24 DIAGNOSIS — F410 Panic disorder [episodic paroxysmal anxiety] without agoraphobia: Secondary | ICD-10-CM | POA: Diagnosis not present

## 2018-09-24 DIAGNOSIS — F418 Other specified anxiety disorders: Secondary | ICD-10-CM | POA: Diagnosis not present

## 2018-09-30 DIAGNOSIS — F4310 Post-traumatic stress disorder, unspecified: Secondary | ICD-10-CM | POA: Diagnosis not present

## 2018-10-16 DIAGNOSIS — F410 Panic disorder [episodic paroxysmal anxiety] without agoraphobia: Secondary | ICD-10-CM | POA: Diagnosis not present

## 2018-10-16 DIAGNOSIS — F418 Other specified anxiety disorders: Secondary | ICD-10-CM | POA: Diagnosis not present

## 2018-10-21 DIAGNOSIS — F331 Major depressive disorder, recurrent, moderate: Secondary | ICD-10-CM | POA: Diagnosis not present

## 2018-10-28 ENCOUNTER — Other Ambulatory Visit: Payer: Self-pay

## 2018-10-28 ENCOUNTER — Ambulatory Visit (INDEPENDENT_AMBULATORY_CARE_PROVIDER_SITE_OTHER): Payer: Federal, State, Local not specified - PPO | Admitting: Neurology

## 2018-10-28 ENCOUNTER — Encounter: Payer: Self-pay | Admitting: Neurology

## 2018-10-28 DIAGNOSIS — G43009 Migraine without aura, not intractable, without status migrainosus: Secondary | ICD-10-CM | POA: Diagnosis not present

## 2018-10-28 DIAGNOSIS — R404 Transient alteration of awareness: Secondary | ICD-10-CM | POA: Diagnosis not present

## 2018-10-28 DIAGNOSIS — F411 Generalized anxiety disorder: Secondary | ICD-10-CM | POA: Diagnosis not present

## 2018-10-28 MED ORDER — TOPIRAMATE 100 MG PO TABS: 100.0000 mg | ORAL_TABLET | Freq: Two times a day (BID) | ORAL | 11 refills | Status: DC

## 2018-10-28 MED ORDER — NORTRIPTYLINE HCL 25 MG PO CAPS: 50.0000 mg | ORAL_CAPSULE | Freq: Every day | ORAL | 4 refills | Status: DC

## 2018-10-28 MED ORDER — RIZATRIPTAN BENZOATE 10 MG PO TBDP: 10.0000 mg | ORAL_TABLET | ORAL | 11 refills | Status: DC | PRN

## 2018-10-28 NOTE — Progress Notes (Signed)
[facility]  PATIENT: Sabrina Robinson DOB: 1986/04/08   REASON FOR VISIT: Follow-up for migraines, episodes of passing out HISTORY FROM: Patient    HISTORY OF PRESENT ILLNESS:Sabrina Robinson  is a 33 years old right-handed female,seen in refer by  her primary care doctor Margaretha Sheffield Gfor evaluation of chronic migraine headache in November 10 2015  She reported history of migraine headaches since middle school, her typical migraine are right lateralized severe pounding headache with associated light noise sensitivity, nauseous, spreceded by a gradual building up of pressure headache at right temporoparietal region, since2016 she has increased frequency of headache, about couple times a week, to currently daily basis now since May 2017,  She was evaluated by headache specialist Dr. Domingo Cocking in 2015, has tried preventive medication Topamax up to 100 mg twice a day, she could not tolerate it due to heart flutter, dizziness, there was no noticeable improvement in her headache after 6 months. She also received trigger point injection without significant improvement,  For abortive treatment, She has tried Imitrex since May 2017, she tried 3 times, it works well for her headaches take away her headache in 2 hours, but she had choking sensation, it is unbearable for her,  Trigger for her migraines are stress, sleep deprivation, weather change, hungry, dehydration, strong smells, bright light, Because of frequent headaches, she has been taking BC powder at least 4 times a week, up to 3 packs each day, frequent almost daily Excedrin Migraine use, for over one year, also tried different over-the-counter medication aspirin Tylenol ibuprofen Aleve without helping her headaches  Today she is also very concerned about her passing out episode, she had multiple episodes since 2002, each episode is similar, she described to episode happened while taking a hot shower, she had sudden onset dizziness,  wooshing sound in her ear, blurry vision, and transient loss of consciousness quickly recovered after she landed on the floor, there was no seizure-like activity noticed,  some of the episode triggered by painful stimulation, such as broken her toes.  UPDATE July 19 2016:Sabrina Robinson I have personally reviewed MRI of the brain with without contrast in July 2017, no acute intracranial abnormality. EEG was normal in July 2017 Laboratory in July 2017, normal CMP, CBC, TSH, B12  She has no more passing out episode, she continue have migraine 2-3 times each months, still has to take Maxalt 2-3 time each month, about 1 hour to abort the headache, but she feels fatigued afterwards.   UPDATE 10/25/2018CM Ms. Mose, 33 year old female returns for follow-up for migraines and passing out episodes. She is having approximately 2 migraines per month usually around her cycle. She denies any syncopal episodes since last seen. Maxalt works acutely but she is still fatigued afterwards. Her migraine triggers are bright lights,  strong smells, lack of sleep dehydration and weather changes. She returns for reevaluation  Virtual Visit via Video  I connected with [name] on 10/28/18 at  by Video and verified that I am speaking with the correct person using two identifiers.   I discussed the limitations, risks, security and privacy concerns of performing an evaluation and management service by video and the availability of in person appointments. I also discussed with the patient that there may be a patient responsible charge related to this service. The patient expressed understanding and agreed to proceed.   History of Present Illness: Last visit was in Oct 2018, she continue to work at the [employer], has migraine headaches 2-3 times  each week, 4-5 times each month, she would have take a day off, her typical migraine are lateralized severe pounding headache with associated light noise sensitivity, nauseous, lasting up to 2  days, Maxalt helps her headache some, she is no longer on nortriptyline for preventive medications, is taking Zoloft, trazodone for her depression insomnia  She also complains of occasionally passing out spells, usually happening in a standing position, feel lightheaded, blurry vision, transient loss of consciousness, no seizure-like activity reported  Observations/Objective: I have reviewed problem lists, medications, allergies.  Assessment and Plan: Chronic Migraine. Passing out spells  Mostly suggestive of syncope, previously had normal MRI of the brain, EEG  Will add on Topamax 100 mg twice a day as migraine prevention  Continue Maxalt as needed  Patient wants FMLA paperwork to be filled out  Follow Up Instructions:  With NP Sarah in 3 months    I discussed the assessment and treatment plan with the patient. The patient was provided an opportunity to ask questions and all were answered. The patient agreed with the plan and demonstrated an understanding of the instructions.   The patient was advised to call back or seek an in-person evaluation if the symptoms worsen or if the condition fails to improve as anticipated.  I provided 30 minutes of non-face-to-face time during this encounter.   Levert FeinsteinYijun Alexas Basulto, MD

## 2018-11-04 DIAGNOSIS — F411 Generalized anxiety disorder: Secondary | ICD-10-CM | POA: Diagnosis not present

## 2018-11-06 DIAGNOSIS — G4700 Insomnia, unspecified: Secondary | ICD-10-CM | POA: Diagnosis not present

## 2018-11-06 DIAGNOSIS — F418 Other specified anxiety disorders: Secondary | ICD-10-CM | POA: Diagnosis not present

## 2018-11-06 DIAGNOSIS — F410 Panic disorder [episodic paroxysmal anxiety] without agoraphobia: Secondary | ICD-10-CM | POA: Diagnosis not present

## 2018-11-19 DIAGNOSIS — F411 Generalized anxiety disorder: Secondary | ICD-10-CM | POA: Diagnosis not present

## 2018-11-25 DIAGNOSIS — F411 Generalized anxiety disorder: Secondary | ICD-10-CM | POA: Diagnosis not present

## 2018-12-02 DIAGNOSIS — F411 Generalized anxiety disorder: Secondary | ICD-10-CM | POA: Diagnosis not present

## 2018-12-09 DIAGNOSIS — F411 Generalized anxiety disorder: Secondary | ICD-10-CM | POA: Diagnosis not present

## 2018-12-16 DIAGNOSIS — F411 Generalized anxiety disorder: Secondary | ICD-10-CM | POA: Diagnosis not present

## 2018-12-30 DIAGNOSIS — F411 Generalized anxiety disorder: Secondary | ICD-10-CM | POA: Diagnosis not present

## 2019-01-06 DIAGNOSIS — F411 Generalized anxiety disorder: Secondary | ICD-10-CM | POA: Diagnosis not present

## 2019-01-13 DIAGNOSIS — F411 Generalized anxiety disorder: Secondary | ICD-10-CM | POA: Diagnosis not present

## 2019-01-20 DIAGNOSIS — F411 Generalized anxiety disorder: Secondary | ICD-10-CM | POA: Diagnosis not present

## 2019-01-21 DIAGNOSIS — F3342 Major depressive disorder, recurrent, in full remission: Secondary | ICD-10-CM | POA: Diagnosis not present

## 2019-01-21 DIAGNOSIS — F331 Major depressive disorder, recurrent, moderate: Secondary | ICD-10-CM | POA: Diagnosis not present

## 2019-01-21 DIAGNOSIS — F411 Generalized anxiety disorder: Secondary | ICD-10-CM | POA: Diagnosis not present

## 2019-01-28 DIAGNOSIS — R110 Nausea: Secondary | ICD-10-CM | POA: Diagnosis not present

## 2019-01-28 DIAGNOSIS — E860 Dehydration: Secondary | ICD-10-CM | POA: Diagnosis not present

## 2019-01-28 DIAGNOSIS — Z1159 Encounter for screening for other viral diseases: Secondary | ICD-10-CM | POA: Diagnosis not present

## 2019-01-28 DIAGNOSIS — F411 Generalized anxiety disorder: Secondary | ICD-10-CM | POA: Diagnosis not present

## 2019-01-28 DIAGNOSIS — R42 Dizziness and giddiness: Secondary | ICD-10-CM | POA: Diagnosis not present

## 2019-02-01 ENCOUNTER — Encounter (HOSPITAL_COMMUNITY): Payer: Self-pay | Admitting: Emergency Medicine

## 2019-02-01 ENCOUNTER — Emergency Department (HOSPITAL_COMMUNITY)
Admission: EM | Admit: 2019-02-01 | Discharge: 2019-02-01 | Disposition: A | Discharge status: Home / Self Care | Payer: Federal, State, Local not specified - PPO | Attending: Emergency Medicine | Admitting: Emergency Medicine

## 2019-02-01 ENCOUNTER — Emergency Department (HOSPITAL_COMMUNITY): Payer: Federal, State, Local not specified - PPO

## 2019-02-01 ENCOUNTER — Other Ambulatory Visit: Payer: Self-pay

## 2019-02-01 DIAGNOSIS — K769 Liver disease, unspecified: Secondary | ICD-10-CM | POA: Diagnosis not present

## 2019-02-01 DIAGNOSIS — Z79899 Other long term (current) drug therapy: Secondary | ICD-10-CM | POA: Insufficient documentation

## 2019-02-01 DIAGNOSIS — R197 Diarrhea, unspecified: Secondary | ICD-10-CM | POA: Diagnosis not present

## 2019-02-01 DIAGNOSIS — R1084 Generalized abdominal pain: Secondary | ICD-10-CM | POA: Diagnosis not present

## 2019-02-01 DIAGNOSIS — R112 Nausea with vomiting, unspecified: Secondary | ICD-10-CM | POA: Diagnosis not present

## 2019-02-01 DIAGNOSIS — R42 Dizziness and giddiness: Secondary | ICD-10-CM | POA: Insufficient documentation

## 2019-02-01 DIAGNOSIS — K7689 Other specified diseases of liver: Secondary | ICD-10-CM | POA: Insufficient documentation

## 2019-02-01 LAB — CBC
HCT: 40.4 % (ref 36.0–46.0)
Hemoglobin: 12.7 g/dL (ref 12.0–15.0)
MCH: 28.9 pg (ref 26.0–34.0)
MCHC: 31.4 g/dL (ref 30.0–36.0)
MCV: 91.8 fL (ref 80.0–100.0)
Platelets: 251 10*3/uL (ref 150–400)
RBC: 4.4 MIL/uL (ref 3.87–5.11)
RDW: 13.2 % (ref 11.5–15.5)
WBC: 4.2 10*3/uL (ref 4.0–10.5)
nRBC: 0 % (ref 0.0–0.2)

## 2019-02-01 LAB — LIPASE, BLOOD: Lipase: 27 U/L (ref 11–51)

## 2019-02-01 LAB — COMPREHENSIVE METABOLIC PANEL
ALT: 12 U/L (ref 0–44)
AST: 14 U/L — ABNORMAL LOW (ref 15–41)
Albumin: 3.9 g/dL (ref 3.5–5.0)
Alkaline Phosphatase: 72 U/L (ref 38–126)
Anion gap: 7 (ref 5–15)
BUN: 6 mg/dL (ref 6–20)
CO2: 27 mmol/L (ref 22–32)
Calcium: 9.3 mg/dL (ref 8.9–10.3)
Chloride: 104 mmol/L (ref 98–111)
Creatinine, Ser: 1.02 mg/dL — ABNORMAL HIGH (ref 0.44–1.00)
GFR calc Af Amer: >60 mL/min (ref >60)
GFR calc non Af Amer: >60 mL/min (ref >60)
Glucose, Bld: 89 mg/dL (ref 70–99)
Potassium: 3.5 mmol/L (ref 3.5–5.1)
Sodium: 138 mmol/L (ref 135–145)
Total Bilirubin: 0.6 mg/dL (ref 0.3–1.2)
Total Protein: 6.9 g/dL (ref 6.5–8.1)

## 2019-02-01 LAB — URINALYSIS, ROUTINE W REFLEX MICROSCOPIC
Bacteria, UA: NONE SEEN
Bilirubin Urine: NEGATIVE
Glucose, UA: NEGATIVE mg/dL
Ketones, ur: NEGATIVE mg/dL
Leukocytes,Ua: NEGATIVE
Nitrite: NEGATIVE
Protein, ur: NEGATIVE mg/dL
Specific Gravity, Urine: 1.044 — ABNORMAL HIGH (ref 1.005–1.030)
pH: 5 (ref 5.0–8.0)

## 2019-02-01 LAB — I-STAT BETA HCG BLOOD, ED (MC, WL, AP ONLY): I-stat hCG, quantitative: <5 m[IU]/mL (ref <5)

## 2019-02-01 MED ORDER — SODIUM CHLORIDE 0.9% FLUSH: 3.0000 mL | Freq: Once | INTRAVENOUS | Status: DC

## 2019-02-01 MED ORDER — PROMETHAZINE HCL 25 MG/ML IJ SOLN
12.5000 mg | Freq: Once | INTRAMUSCULAR | Status: AC
  Administered 2019-02-01: 12.5 mg via INTRAVENOUS
  Filled 2019-02-01: qty 1

## 2019-02-01 MED ORDER — ONDANSETRON 4 MG PO TBDP: ORAL_TABLET | ORAL | 0 refills | Status: DC

## 2019-02-01 MED ORDER — DICYCLOMINE HCL 10 MG PO CAPS
10.0000 mg | ORAL_CAPSULE | Freq: Once | ORAL | Status: AC
  Administered 2019-02-01: 10 mg via ORAL
  Filled 2019-02-01: qty 1

## 2019-02-01 MED ORDER — SODIUM CHLORIDE 0.9 % IV BOLUS
1000.0000 mL | Freq: Once | INTRAVENOUS | Status: AC
  Administered 2019-02-01: 1000 mL via INTRAVENOUS

## 2019-02-01 MED ORDER — DICYCLOMINE HCL 20 MG PO TABS: 20.0000 mg | ORAL_TABLET | Freq: Two times a day (BID) | ORAL | 0 refills | Status: DC

## 2019-02-01 MED ORDER — IOHEXOL 300 MG/ML  SOLN
100.0000 mL | Freq: Once | INTRAMUSCULAR | Status: AC | PRN
  Administered 2019-02-01: 100 mL via INTRAVENOUS

## 2019-02-01 NOTE — ED Provider Notes (Signed)
[facility] DEPARTMENT Provider Note   CSN: [id number] Arrival date & time: 02/01/19  1332    History   Chief Complaint Chief Complaint  Patient presents with  . Diarrhea  . Nausea   HPI Sabrina Robinson is a 33 y.o. female with past medical history significant for migraines, syncopal episodes who presents for evaluation of nausea, vomiting, diarrhea.  Patient states she was seen at urgent care and given Zofran.  Patient states she has had persistent nausea.  Is having multiple episodes of diarrhea which is been without melena or hematochezia.  Generalized abdominal cramping however no focal tenderness.  Denies fever, chills, cough, upper respiratory symptoms, chest pain, shortness of breath, new lateral weakness.  Patient states she does get mild dizziness when going from sitting to standing.  States she is able to tolerate p.o. intake however it "comes out just fast that goes in."  She denies prior history of ulcerative colitis, recent travel, recent antibiotic use.  No known COVID exposures.  Tested -1-week ago.  Denies additional aggravating or alleviating factors.  History  obtained from patient and past medical records.  No interpreter is used.     HPI  Past Medical History:  Diagnosis Date  . Migraines   . Syncopal episodes     Patient Active Problem List   Diagnosis Date Noted  . 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 DEPRESSIVE DISORDER, NOS 07/04/2006  . Migraine headache 07/04/2006  . BACK PAIN, LOW 07/04/2006    Past Surgical History:  Procedure Laterality Date  . NO PAST SURGERIES       OB History   No obstetric history on file.      Home Medications    Prior to Admission medications   Medication Sig Start Date End Date Taking? Authorizing Provider  ALPRAZolam Duanne Moron) 1 MG tablet Take 1 mg by mouth at bedtime as needed for anxiety.    [provider]  ibuprofen (ADVIL,MOTRIN) 800 MG tablet Take 1 tablet (800 mg total)  by mouth 3 (three) times daily. 10/10/17   Zigmund Gottron, NP  norethindrone-ethinyl estradiol (MICROGESTIN,JUNEL,LOESTRIN) 1-20 MG-MCG tablet Take 1 tablet by mouth daily.    [provider]  omeprazole (PRILOSEC) 20 MG capsule Take 1 capsule (20 mg total) by mouth daily. 08/12/17   Zigmund Gottron, NP  ondansetron (ZOFRAN ODT) 4 MG disintegrating tablet Take 1 tablet (4 mg total) by mouth every 8 (eight) hours as needed for nausea or vomiting. 11/10/15   Marcial Pacas, MD  rizatriptan (MAXALT-MLT) 10 MG disintegrating tablet Take 1 tablet (10 mg total) by mouth as needed. May repeat in 2 hours if needed 10/28/18   Marcial Pacas, MD  sertraline (ZOLOFT) 50 MG tablet Take 50 mg by mouth daily.    [provider]  topiramate (TOPAMAX) 100 MG tablet Take 1 tablet (100 mg total) by mouth 2 (two) times daily. 10/28/18   Marcial Pacas, MD  traZODone (DESYREL) 100 MG tablet Take 100 mg by mouth at bedtime.    [provider]    Family History Family History  Problem Relation Age of Onset  . Hypertension Mother     Social History Social History   Tobacco Use  . Smoking status: Never Smoker  . Smokeless tobacco: Never Used  Substance Use Topics  . Alcohol use: Yes    Alcohol/week: 0.0 standard drinks    Comment: occasionally  . Drug use: No     Allergies   Patient has  no known allergies.   Review of Systems Review of Systems  Constitutional: Negative.   HENT: Negative.   Respiratory: Negative.   Cardiovascular: Negative.   Gastrointestinal: Positive for abdominal pain, diarrhea, nausea and vomiting. Negative for abdominal distention, anal bleeding, blood in stool, constipation and rectal pain.  Genitourinary: Negative.   Musculoskeletal: Negative.   Skin: Negative.   Neurological: Positive for dizziness (Going from sitting to standing). Negative for tremors, seizures, syncope, facial asymmetry, speech difficulty, weakness, light-headedness, numbness and headaches.   All other systems reviewed and are negative.    Physical Exam Updated Vital Signs BP 123/60 (BP Location: Right Arm)   Pulse 85   Temp 98.6 F (37 C) (Oral)   Resp 18   LMP 01/20/2019 (Exact Date)   SpO2 100%   Physical Exam Vitals signs and nursing note reviewed.  Constitutional:      General: She is not in acute distress.    Appearance: She is well-developed. She is not ill-appearing, toxic-appearing or diaphoretic.     Comments: Texting on phone on initial evaluation.  HENT:     Head: Normocephalic and atraumatic.     Nose: Nose normal.     Mouth/Throat:     Mouth: Mucous membranes are dry.  Eyes:     Pupils: Pupils are equal, round, and reactive to light.  Neck:     Musculoskeletal: Normal range of motion.  Cardiovascular:     Rate and Rhythm: Normal rate.     Pulses: Normal pulses.     Heart sounds: Normal heart sounds.  Pulmonary:     Effort: Pulmonary effort is normal. No respiratory distress.     Breath sounds: Normal breath sounds.  Abdominal:     General: There is no distension.     Comments: Soft, nontender without rebound or guarding.  Normoactive bowel sounds.  No abdominal wall herniations.  Musculoskeletal: Normal range of motion.     Comments: Moves all 4 extremities without difficulty.  Skin:    General: Skin is warm and dry.     Comments: Brisk capillary refill.  No rashes, lesions, no pallor.  Neurological:     Mental Status: She is alert.     Comments: Cranial nerves II through XII grossly intact.  No facial droop. Negative finger-to-nose, Romberg, negative heel-to-shin.  Ambulatory without ataxic gait.    ED Treatments / Results  Labs (all labs ordered are listed, but only abnormal results are displayed) Labs Reviewed  COMPREHENSIVE METABOLIC PANEL - Abnormal; Notable for the following components:      Result Value   Creatinine, Ser 1.02 (*)    AST 14 (*)    All other components within normal limits  LIPASE, BLOOD  CBC  URINALYSIS,  ROUTINE W REFLEX MICROSCOPIC  GI PATHOGEN PANEL BY PCR, STOOL  I-STAT BETA HCG BLOOD, ED ([facility], [facility], [facility] ONLY)    EKG EKG Interpretation  Date/Time:  Sunday February 01 2019 14:57:01 EDT Ventricular Rate:  76 PR Interval:    QRS Duration: 121 QT Interval:  415 QTC Calculation: 473 R Axis:   39 Text Interpretation:  Sinus rhythm no STEMI no old comparison Confirmed by Arby Barrette [phone]) on 02/01/2019 3:02:12 PM   Radiology No results found.  Procedures Procedures (including critical care time)  Medications Ordered in ED Medications  sodium chloride flush (NS) 0.9 % injection 3 mL (has no administration in time range)  sodium chloride 0.9 % bolus 1,000 mL (1,000 mLs Intravenous New Bag/Given 02/01/19 1600)  promethazine (PHENERGAN) injection 12.5 mg (12.5 mg Intravenous Given 02/01/19 1603)  dicyclomine (BENTYL) capsule 10 mg (10 mg Oral Given 02/01/19 1605)    Initial Impression / Assessment and Plan / ED Course  I have reviewed the triage vital signs and the nursing notes.  Pertinent labs & imaging results that were available during my care of the patient were reviewed by me and considered in my medical decision making (see chart for details).  [age] old female peers otherwise well presents for evaluation of nausea, vomiting, diarrhea as well as dizziness.  Symptom onset 1 week ago.  Treated by urgent care with Zofran without little relief of symptoms.  Emesis, diarrhea without melena, hematochezia.  Nonbilious emesis.  Abdomen with no focal tenderness on exam.  Nonsurgical abdomen.  No recent antibiotic use, recent travel.  No known code exposures, was tested 1 week ago and was negative.  Heart and lungs clear.  Nonfocal neurologic exam without deficits.  Low suspicion for CVA as cause of her dizziness.  Labs obtained from triage.  Will give symptomatic management, IV fluids and reevaluate.  CBC without leukocytosis Metabolic panel with creatinine 1.02, no additional electrolyte,  renal or liver abnormality Lipase 27 Pregnancy test negative Urinalysis pedning GI pathogen pending CT AP pending EKG No STEMI Orthostatic VS pending  Patient getting IV fluids, Phenergan, Bentyl.  Plan follow-up on GI pathogen, urinalysis, CT scan, orthostatic vital signs.  [facility] [place]  If CT scan negative likely DC home with symptomatic management if symptoms improve.        Final Clinical Impressions(s) / ED Diagnoses   Final diagnoses:  Nausea vomiting and diarrhea  Generalized abdominal pain    ED Discharge Orders    None       Hailyn Zarr A, PA-C 02/01/19 1622    Arby BarrettePfeiffer, Marcy, MD 02/01/19 1630

## 2019-02-01 NOTE — ED Notes (Signed)
Patient verbalizes understanding of discharge instructions . Opportunity for questions and answers were provided . Armband removed by staff ,Pt discharged from ED. W/C  offered at D/C  and Declined W/C at D/C and was escorted to lobby by RN.  

## 2019-02-01 NOTE — ED Triage Notes (Signed)
Pt has not had a diarrhea during ED stay.

## 2019-02-01 NOTE — Discharge Instructions (Signed)
Your work-up today has been reassuring, labs and CT scan overall look good.  We did see a 2 cm liver lesion, which I would like for you to follow-up with your PCP so they can do an MRI regarding this.  I do not think this is contributing to your current pain.  Take Bentyl and Zofran as well as over-the-counter fiber supplements backslash or probiotics to help with pain and diarrhea.  If you continue having diarrhea you can follow-up with your primary doctor and taken a stool sample to them for testing.  If you develop fevers, worsening pain or any other new or concerning symptoms return to the ED.

## 2019-02-01 NOTE — ED Triage Notes (Addendum)
Pt reports ongoing nausea and diarrhea x 1 week, denies emesis.  Pt has been eval for same at [facility], given zofran with little relief.  Pt c/o dizziness.  Pt denies fever, chills, COVID test on Wed negative.

## 2019-02-01 NOTE — ED Provider Notes (Signed)
Care assumed from PA Henderly, please see her note for full details, but in brief Sabrina Robinson is a 33 y.o. female who presents with 1 week of intermittent cramping abdominal pains with associated nausea vomiting and diarrhea.  No blood in the stools.  Abdominal exam is nonfocal but due to persistent symptoms CT ordered.  Lab work thus far has been reassuring.  Patient has had some lightheadedness, likely due to diarrhea, no associated headaches, chest pain or shortness of breath.  Symptomatic treatment given.  Plan: Follow-up CT scan, if negative and symptoms have improved patient can be discharged home with symptomatic treatment.  Will collect GI pathogen panel if patient is able to have a bowel movement here in the department.  BP 123/60 (BP Location: Right Arm)   Pulse 85   Temp 98.6 F (37 C) (Oral)   Resp 18   LMP 01/20/2019 (Exact Date)   SpO2 100%   ED Course/Procedures   Labs Reviewed  COMPREHENSIVE METABOLIC PANEL - Abnormal; Notable for the following components:      Result Value   Creatinine, Ser 1.02 (*)    AST 14 (*)    All other components within normal limits  URINALYSIS, ROUTINE W REFLEX MICROSCOPIC - Abnormal; Notable for the following components:   Color, Urine STRAW (*)    Specific Gravity, Urine 1.044 (*)    Hgb urine dipstick SMALL (*)    All other components within normal limits  LIPASE, BLOOD  CBC  GI PATHOGEN PANEL BY PCR, STOOL  I-STAT BETA HCG BLOOD, ED ([facility], [facility], [facility] ONLY)   Ct Abdomen Pelvis W Contrast  Result Date: 02/01/2019 CLINICAL DATA:  Nausea, nausea and diarrhea for 1 week. EXAM: CT ABDOMEN AND PELVIS WITH CONTRAST TECHNIQUE: Multidetector CT imaging of the abdomen and pelvis was performed using the standard protocol following bolus administration of intravenous contrast. CONTRAST:  OMNIPAQUE IOHEXOL 300 MG/ML  SOLN COMPARISON:  None. FINDINGS: Lower chest: The lung bases are clear of acute process. No pleural effusion or pulmonary lesions. The  heart is normal in size. No pericardial effusion. The distal esophagus and aorta are unremarkable. Hepatobiliary: There is a 2 cm enhancing lesion at the right hepatic dome. This is an indeterminate finding but could reflect a flash filling hemangioma, adenoma or FNH. Recommend MRI abdomen without and with contrast for further evaluation. Simple appearing right hepatic lobe cyst posteriorly is noted. The gallbladder is contracted. No intra or extrahepatic biliary dilatation. Pancreas: No mass, inflammation or ductal dilatation. Spleen: Normal size.  No focal lesions. Adrenals/Urinary Tract: The adrenal glands and kidneys are unremarkable. There are small simple cysts bilaterally. No renal or obstructing ureteral calculi. No bladder calculi or mass. Stomach/Bowel: The stomach, duodenum, small bowel and colon are grossly normal without oral contrast. No acute inflammatory changes, mass lesions or obstructive findings. I do not see the appendix for certain but I do not see any findings to suggest acute appendicitis. Vascular/Lymphatic: The aorta is normal in caliber. No dissection. The branch vessels are patent. The major venous structures are patent. No mesenteric or retroperitoneal mass or adenopathy. Small scattered lymph nodes are noted. Reproductive: The uterus and ovaries are unremarkable. Small rim enhancing cyst associated with the right ovary is likely a corpus luteum cyst. Other: No significant free pelvic fluid collections. No pelvic adenopathy. No inguinal adenopathy. Musculoskeletal: No significant bony findings. IMPRESSION: 1. Indeterminate 2 cm lesion at the hepatic dome. This could represent an adenoma, FNH, flash filling hemangioma or possibly  a vascular shunt. Recommend MRI abdomen without and with contrast for further evaluation. 2. No acute abdominal/pelvic findings, mass lesions or adenopathy. 3. Contracted gallbladder without definite gallstones. Electronically Signed   By: Marijo Sanes M.D.    On: 02/01/2019 17:39    Procedures  MDM   33 year old female presenting with nausea vomiting diarrhea, associated with lightheadedness.  Generalized cramping abdominal pain.  Lab work is overall been reassuring.  CT scan shows a 2 cm lesion within the hepatic dome, I doubt this is what is causing patient's pain, will have her follow-up with PCP for outpatient MRI for better characterization.  There is no other acute findings within the abdomen or pelvis.  Contracted gallbladder but no evidence of gallstones.  Lab work reassuring, no UTI, creatinine slightly elevated, I suspect this is in the setting of dehydration from recurrent diarrhea.  Patient reports lightheadedness has resolved with IV fluids.  Will discharge patient home with Bentyl, Zofran I have also encouraged her to use a probiotic supplement and fiber supplement.  PCP follow-up recommended.  GI pathogen panel has been ordered but patient has not had any additional episodes of diarrhea here in the ED.  Return precautions discussed.  Patient expresses understanding and agreement with plan.  Discharged home in good condition.  Final diagnoses:  Nausea vomiting and diarrhea  Generalized abdominal pain  Liver lesion          Sabrina Robinson 02/01/19 Kathyrn Drown    Drenda Freeze, MD 02/01/19 2150

## 2019-02-05 DIAGNOSIS — F411 Generalized anxiety disorder: Secondary | ICD-10-CM | POA: Diagnosis not present

## 2019-02-13 DIAGNOSIS — F411 Generalized anxiety disorder: Secondary | ICD-10-CM | POA: Diagnosis not present

## 2019-02-18 DIAGNOSIS — F411 Generalized anxiety disorder: Secondary | ICD-10-CM | POA: Diagnosis not present

## 2019-02-18 DIAGNOSIS — F331 Major depressive disorder, recurrent, moderate: Secondary | ICD-10-CM | POA: Diagnosis not present

## 2019-02-24 ENCOUNTER — Telehealth: Payer: Self-pay | Admitting: *Deleted

## 2019-02-24 ENCOUNTER — Ambulatory Visit: Payer: Federal, State, Local not specified - PPO | Admitting: Neurology

## 2019-02-24 ENCOUNTER — Encounter: Payer: Self-pay | Admitting: Neurology

## 2019-02-24 ENCOUNTER — Other Ambulatory Visit: Payer: Self-pay

## 2019-02-24 VITALS — BP 108/69 | HR 76 | Temp 97.7°F | Ht 64.0 in | Wt 146.5 lb

## 2019-02-24 DIAGNOSIS — F411 Generalized anxiety disorder: Secondary | ICD-10-CM | POA: Diagnosis not present

## 2019-02-24 DIAGNOSIS — G43009 Migraine without aura, not intractable, without status migrainosus: Secondary | ICD-10-CM | POA: Insufficient documentation

## 2019-02-24 MED ORDER — AIMOVIG 70 MG/ML ~~LOC~~ SOAJ: 70.0000 mg | SUBCUTANEOUS | 11 refills | Status: DC

## 2019-02-24 NOTE — Telephone Encounter (Signed)
PA for Aimovig 70mg  approved by [employer] [phone]).  Pt GN#[id number].  Approval valid through 08/23/2019.  Reference for call: [id number].

## 2019-02-24 NOTE — Progress Notes (Signed)
PATIENT: Sabrina Robinson DOB: 15-Apr-1986  Chief Complaint  Patient presents with  . Migraine/Depression    She estimates having 3-4 migraines per month.  Rizatriptan is helpful for her pain but it makes her sleepy.  She uses ibuprofen 800mg  when she is at work.  She is now under the care of a psychiatrist.  States all of her maintenance medications were stopped with she was started on risperdol.       HISTORICAL  WANDALEE KLANG  is a 33 years old right-handed female,seen in refer by  her primary care doctor 26 Gfor evaluation of chronic migraine headache in November 10 2015  She reported history of migraine headaches since middle school, her typical migraine are right lateralized severe pounding headache with associated light noise sensitivity, nauseous,  preceded by a gradual building up of pressure headache at right temporoparietal region, since 2016 she has increased frequency of headache, about couple times a week, to currently daily basis now since May 2017,  She was evaluated by headache specialist Dr. 01-31-1981 in 2015, has tried preventive medication Topamax up to 100 mg twice a day, she could not tolerate it due to heart flutter, dizziness, there was no noticeable improvement in her headache after 6 months. She also received trigger point injection without significant improvement,  For abortive treatment, She has tried Imitrex since May 2017, she tried 3 times, it works well for her headaches take away her headache in 2 hours, but she had choking sensation, it is unbearable for her,  Trigger for her migraines are stress, sleep deprivation, weather change, hungry, dehydration, strong smells, bright light, Because of frequent headaches, she has been taking BC powder at least 4 times a week, up to 3 packs each day, frequent almost daily Excedrin Migraine use, for over one year, also tried different over-the-counter medication aspirin Tylenol ibuprofen Aleve without helping her headaches   Today she is also very concerned about her passing out episode, she had multiple episodes since 2002, each episode is similar, she described the episode happened while taking a hot shower, she had sudden onset dizziness, wooshing sound in her ear, blurry vision, and transient loss of consciousness quickly recovered after she landed on the floor, there was no seizure-like activity noticed,  some of the episode triggered by painful stimulation, such as broken her toes.  UPDATE July 19 2016:YY I have personally reviewed MRI of the brain with without contrast in July 2017, no acute intracranial abnormality. EEG was normal in July 2017 Laboratory in July 2017, normal CMP, CBC, TSH, B12  She has no more passing out episode, she continue have migraine 2-3 times each months, still has to take Maxalt 2-3 time each month, about 1 hour to abort the headache, but she feels fatigued afterwards.   Virtual Visit via Video October 28 2018  Last visit was in Oct 2018, she continue to work at the [employer], has migraine headaches 2-3 times each week, 4-5 times each month, she would have take a day off, her typical migraine are lateralized severe pounding headache with associated light noise sensitivity, nauseous, lasting up to 2 days, Maxalt helps her headache some, she is no longer on nortriptyline for preventive medications, is taking Zoloft, trazodone for her depression insomnia  She also complains of occasionally passing out spells, usually happening in a standing position, feel lightheaded, blurry vision, transient loss of consciousness, no seizure-like activity reported  UPDATE Feb 24 2019: She continue have 3-5 migraine headaches each  month, she was recently seen by psychiatrist, she only tried Topamax 100 mg twice a day for 2 weeks, did not help her headache, it was stopped by her psychiatrist, she was recently started on Risperdal at night.  Maxalt works well for her headaches, she complains of sleepiness  afterwards,  REVIEW OF SYSTEMS: Full 14 system review of systems performed and notable only for as above All other review of systems were negative.  ALLERGIES: No Known Allergies  HOME MEDICATIONS: Current Outpatient Medications  Medication Sig Dispense Refill  . ibuprofen (ADVIL,MOTRIN) 800 MG tablet Take 1 tablet (800 mg total) by mouth 3 (three) times daily. 21 tablet 0  . ondansetron (ZOFRAN ODT) 4 MG disintegrating tablet 4mg  ODT q4 hours prn nausea/vomit 10 tablet 0  . risperiDONE (RISPERDAL) 2 MG tablet Take 2 mg by mouth at bedtime.    . rizatriptan (MAXALT-MLT) 10 MG disintegrating tablet Take 1 tablet (10 mg total) by mouth as needed. May repeat in 2 hours if needed 15 tablet 11   No current facility-administered medications for this visit.     PAST MEDICAL HISTORY: Past Medical History:  Diagnosis Date  . Migraines   . Syncopal episodes     PAST SURGICAL HISTORY: Past Surgical History:  Procedure Laterality Date  . NO PAST SURGERIES      FAMILY HISTORY: Family History  Problem Relation Age of Onset  . Hypertension Mother     SOCIAL HISTORY: Social History   Socioeconomic History  . Marital status: Single    Spouse name: Not on file  . Number of children: 0  . Years of education: Bachelors  . Highest education level: Not on file  Occupational History  . Occupation: Dispensing optician for [employer]  . Financial resource strain: Not on file  . Food insecurity    Worry: Not on file    Inability: Not on file  . Transportation needs    Medical: Not on file    Non-medical: Not on file  Tobacco Use  . Smoking status: Never Smoker  . Smokeless tobacco: Never Used  Substance and Sexual Activity  . Alcohol use: Yes    Alcohol/week: 0.0 standard drinks    Comment: occasionally  . Drug use: No  . Sexual activity: Not on file  Lifestyle  . Physical activity    Days per week: Not on file    Minutes per session: Not on file  . Stress: Not on file   Relationships  . Social Herbalist on phone: Not on file    Gets together: Not on file    Attends religious service: Not on file    Active member of club or organization: Not on file    Attends meetings of clubs or organizations: Not on file    Relationship status: Not on file  . Intimate partner violence    Fear of current or ex partner: Not on file    Emotionally abused: Not on file    Physically abused: Not on file    Forced sexual activity: Not on file  Other Topics Concern  . Not on file  Social History Narrative   Lives at home with her mother.   Right-handed.   1 cup caffeine daily.     PHYSICAL EXAM   Vitals:   02/24/19 0856  BP: 108/69  Pulse: 76  Temp: 97.7 F (36.5 C)  Weight: 146 lb 8 oz (66.5 kg)  Height: 5\' 4"  (1.626 m)  Not recorded      Body mass index is 25.15 kg/m.  PHYSICAL EXAMNIATION:  Gen: NAD, conversant, well nourised, well groomed                     Cardiovascular: Regular rate rhythm, no peripheral edema, warm, nontender. Eyes: Conjunctivae clear without exudates or hemorrhage Neck: Supple, no carotid bruits. Pulmonary: Clear to auscultation bilaterally   NEUROLOGICAL EXAM:  MENTAL STATUS: Speech:    Speech is normal; fluent and spontaneous with normal comprehension.  Cognition:     Orientation to time, place and person     Normal recent and remote memory     Normal Attention span and concentration     Normal Language, naming, repeating,spontaneous speech     Fund of knowledge   CRANIAL NERVES: CN II: Visual fields are full to confrontation.  Pupils are round equal and briskly reactive to light. CN III, IV, VI: extraocular movement are normal. No ptosis. CN V: Facial sensation is intact to pinprick in all 3 divisions bilaterally. Corneal responses are intact.  CN VII: Face is symmetric with normal eye closure and smile. CN VIII: Hearing is normal to causal conversation. CN IX, X: Palate elevates symmetrically.  Phonation is normal. CN XI: Head turning and shoulder shrug are intact CN XII: Tongue is midline with normal movements and no atrophy.  MOTOR: There is no pronator drift of out-stretched arms. Muscle bulk and tone are normal. Muscle strength is normal.  REFLEXES: Reflexes are 2+ and symmetric at the biceps, triceps, knees, and ankles. Plantar responses are flexor.  SENSORY: Intact to light touch, pinprick, positional sensation and vibratory sensation are intact in fingers and toes.  COORDINATION: Rapid alternating movements and fine finger movements are intact. There is no dysmetria on finger-to-nose and heel-knee-shin.    GAIT/STANCE: Posture is normal. Gait is steady with normal steps, base, arm swing, and turning. Heel and toe walking are normal. Tandem gait is normal.  Romberg is absent.   DIAGNOSTIC DATA (LABS, IMAGING, TESTING) - I reviewed patient records, labs, notes, testing and imaging myself where available.   ASSESSMENT AND PLAN  Leandro ReasonerSelina L Lupton is a 33 y.o. female   Chronic migraine headaches  Previously tried failed nortriptyline, Zoloft, Topamax, trazodone  Will start Aimovig 70 mg every night as preventive medications  Maxalt as needed.  Levert FeinsteinYijun Issacc Merlo, M.D. Ph.D.  [facility] [address], [address] [place], [place] [address] Ph: [phone] Fax: [phone]  CC: Referring Provider

## 2019-02-25 ENCOUNTER — Telehealth: Payer: Self-pay | Admitting: *Deleted

## 2019-02-25 DIAGNOSIS — Z0289 Encounter for other administrative examinations: Secondary | ICD-10-CM

## 2019-02-25 NOTE — Telephone Encounter (Signed)
FMLA ppw completed, signed by MD and return to medical records.

## 2019-03-18 DIAGNOSIS — F411 Generalized anxiety disorder: Secondary | ICD-10-CM | POA: Diagnosis not present

## 2019-03-18 DIAGNOSIS — F331 Major depressive disorder, recurrent, moderate: Secondary | ICD-10-CM | POA: Diagnosis not present

## 2019-03-31 DIAGNOSIS — F411 Generalized anxiety disorder: Secondary | ICD-10-CM | POA: Diagnosis not present

## 2019-04-07 DIAGNOSIS — F411 Generalized anxiety disorder: Secondary | ICD-10-CM | POA: Diagnosis not present

## 2019-04-21 DIAGNOSIS — F411 Generalized anxiety disorder: Secondary | ICD-10-CM | POA: Diagnosis not present

## 2019-04-23 DIAGNOSIS — F331 Major depressive disorder, recurrent, moderate: Secondary | ICD-10-CM | POA: Diagnosis not present

## 2019-04-23 DIAGNOSIS — F411 Generalized anxiety disorder: Secondary | ICD-10-CM | POA: Diagnosis not present

## 2019-05-07 DIAGNOSIS — Z01419 Encounter for gynecological examination (general) (routine) without abnormal findings: Secondary | ICD-10-CM | POA: Diagnosis not present

## 2019-05-07 DIAGNOSIS — Z113 Encounter for screening for infections with a predominantly sexual mode of transmission: Secondary | ICD-10-CM | POA: Diagnosis not present

## 2019-05-19 DIAGNOSIS — F411 Generalized anxiety disorder: Secondary | ICD-10-CM | POA: Diagnosis not present

## 2019-06-16 DIAGNOSIS — F411 Generalized anxiety disorder: Secondary | ICD-10-CM | POA: Diagnosis not present

## 2019-06-24 DIAGNOSIS — F331 Major depressive disorder, recurrent, moderate: Secondary | ICD-10-CM | POA: Diagnosis not present

## 2019-06-24 DIAGNOSIS — F411 Generalized anxiety disorder: Secondary | ICD-10-CM | POA: Diagnosis not present

## 2019-07-20 DIAGNOSIS — F411 Generalized anxiety disorder: Secondary | ICD-10-CM | POA: Diagnosis not present

## 2019-08-18 ENCOUNTER — Ambulatory Visit: Payer: Federal, State, Local not specified - PPO | Admitting: Neurology

## 2019-08-18 ENCOUNTER — Encounter: Payer: Self-pay | Admitting: Neurology

## 2019-08-18 ENCOUNTER — Other Ambulatory Visit: Payer: Self-pay

## 2019-08-18 VITALS — BP 118/72 | HR 74 | Temp 98.2°F | Ht 64.0 in | Wt 158.0 lb

## 2019-08-18 DIAGNOSIS — G43009 Migraine without aura, not intractable, without status migrainosus: Secondary | ICD-10-CM

## 2019-08-18 MED ORDER — ONDANSETRON 4 MG PO TBDP: 4.0000 mg | ORAL_TABLET | Freq: Three times a day (TID) | ORAL | 1 refills | Status: AC | PRN

## 2019-08-18 MED ORDER — RIZATRIPTAN BENZOATE 10 MG PO TBDP: 10.0000 mg | ORAL_TABLET | ORAL | 11 refills | Status: AC | PRN

## 2019-08-18 MED ORDER — AIMOVIG 70 MG/ML ~~LOC~~ SOAJ: 70.0000 mg | SUBCUTANEOUS | 11 refills | Status: AC

## 2019-08-18 NOTE — Patient Instructions (Signed)
Try Aimvoig 70 mg every month for headache prevention  Continue the Maxalt as needed

## 2019-08-18 NOTE — Progress Notes (Signed)
PATIENT: Sabrina Robinson DOB: 12-24-1985  REASON FOR VISIT: follow up HISTORY FROM: patient  HISTORY OF PRESENT ILLNESS: Today 08/18/19  HISTORY  Jacqulene L Popeis a 34 years old right-handed female,seen in refer by her primary care doctor Margaretha Sheffield Gfor evaluation of chronic migraine headache in November 10 2015  She reported history of migraine headaches since middle school, her typical migraine are right lateralized severe pounding headache with associated light noise sensitivity, nauseous,  preceded by a gradual building up of pressure headache at right temporoparietal region, since 2016 she has increased frequency of headache, about couple times a week, to currently daily basis now since May 2017,  She was evaluated by headache specialist Dr. Domingo Cocking in 2015, has tried preventive medication Topamax up to 100 mg twice a day, she could not tolerate it due to heart flutter, dizziness, there was no noticeable improvement in her headache after 6 months. She also received trigger point injection without significant improvement,  For abortive treatment, She has tried Imitrex since May 2017, she tried 3 times, it works well for her headaches take away her headache in 2 hours, but she had choking sensation, it is unbearable for her,  Trigger for her migraines are stress, sleep deprivation, weather change, hungry, dehydration, strong smells, bright light, Because of frequent headaches, she has been taking BC powder at least 4 times a week, up to 3 packs each day, frequent almost daily Excedrin Migraine use, for over one year, also tried different over-the-counter medication aspirin Tylenol ibuprofen Aleve without helping her headaches  Today she is also very concerned about her passing out episode, she had multiple episodes since 2002, each episode is similar, she described the episode happened while taking a hot shower, she had sudden onset dizziness, wooshing sound in her ear, blurry vision, and  transient loss of consciousness quickly recovered after she landed on the floor, there was no seizure-like activity noticed, some of the episode triggered by painful stimulation, such as broken her toes.  UPDATE July 19 2016:YY I have personally reviewed MRI of the brain with without contrast in July 2017, no acute intracranial abnormality. EEG was normal in July 2017 Laboratory in July 2017, normal CMP, CBC, TSH, B12  She has no more passing out episode, she continue have migraine 2-3 times each months, still has to take Maxalt 2-3 time each month,about 1 hour to abort the headache, but she feels fatigued afterwards.   Virtual Visit via Video October 28 2018  Last visit was in Oct 2018, she continue to work at the [employer], has migraine headaches 2-3 times each week, 4-5 times each month, she would have take a day off, her typical migraine are lateralized severe pounding headache with associated light noise sensitivity, nauseous, lasting up to 2 days, Maxalt helps her headache some, she is no longer on nortriptyline for preventive medications, is taking Zoloft, trazodone for her depression insomnia  She also complains of occasionally passing out spells, usually happening in a standing position, feel lightheaded, blurry vision, transient loss of consciousness, no seizure-like activity reported  UPDATE Feb 24 2019: She continue have 3-5 migraine headaches each month, she was recently seen by psychiatrist, she only tried Topamax 100 mg twice a day for 2 weeks, did not help her headache, it was stopped by her psychiatrist, she was recently started on Risperdal at night.  Maxalt works well for her headaches, she complains of sleepiness afterwards,  Update August 18, 2019 SS: Continues to have about 3-6  migraine headaches each month, is on Risperdal 2 mg at bedtime from psychiatry.  She has nortriptyline that she will take intermittently for headache prevention.  She did not start Aimovig, due to fear  of needles.  She works at the [employer], may miss 3 days a month of work due to headache.  Maxalt works well, afterwards, drowsy, has to sleep.  She also has Zofran to take for nausea with headache.  REVIEW OF SYSTEMS: Out of a complete 14 system review of symptoms, the patient complains only of the following symptoms, and all other reviewed systems are negative.  Headache  ALLERGIES: No Known Allergies  HOME MEDICATIONS: Outpatient Medications Prior to Visit  Medication Sig Dispense Refill  . ibuprofen (ADVIL,MOTRIN) 800 MG tablet Take 1 tablet (800 mg total) by mouth 3 (three) times daily. 21 tablet 0  . risperiDONE (RISPERDAL) 2 MG tablet Take 2 mg by mouth at bedtime.    . nortriptyline (PAMELOR) 25 MG capsule Take 50 mg by mouth at bedtime.    . ondansetron (ZOFRAN ODT) 4 MG disintegrating tablet 4mg  ODT q4 hours prn nausea/vomit 10 tablet 0  . rizatriptan (MAXALT-MLT) 10 MG disintegrating tablet Take 1 tablet (10 mg total) by mouth as needed. May repeat in 2 hours if needed 15 tablet 11  . Erenumab-aooe (AIMOVIG) 70 MG/ML SOAJ Inject 70 mg into the skin every 30 (thirty) days. 1 mL 11   No facility-administered medications prior to visit.    PAST MEDICAL HISTORY: Past Medical History:  Diagnosis Date  . Migraines   . Syncopal episodes     PAST SURGICAL HISTORY: Past Surgical History:  Procedure Laterality Date  . NO PAST SURGERIES      FAMILY HISTORY: Family History  Problem Relation Age of Onset  . Hypertension Mother     SOCIAL HISTORY: Social History   Socioeconomic History  . Marital status: Single    Spouse name: Not on file  . Number of children: 0  . Years of education: Bachelors  . Highest education level: Not on file  Occupational History  . Occupation: for Museum/gallery curator  . Smoking status: Never Smoker  . Smokeless tobacco: Never Used  Substance and Sexual Activity  . Alcohol use: Yes    Alcohol/week: 0.0 standard drinks     Comment: occasionally  . Drug use: No  . Sexual activity: Not on file  Other Topics Concern  . Not on file  Social History Narrative   Lives at home with her mother.   Right-handed.   1 cup caffeine daily.   Social Determinants of Health   Financial Resource Strain:   . Difficulty of Paying Living Expenses:   Food Insecurity:   . Worried About [employer] in the Last Year:   . Programme researcher, broadcasting/film/video in the Last Year:   Transportation Needs:   . Barista (Medical):   Freight forwarder Lack of Transportation (Non-Medical):   Physical Activity:   . Days of Exercise per Week:   . Minutes of Exercise per Session:   Stress:   . Feeling of Stress :   Social Connections:   . Frequency of Communication with Friends and Family:   . Frequency of Social Gatherings with Friends and Family:   . Attends Religious Services:   . Active Member of Clubs or Organizations:   . Attends [employer] Meetings:   Banker Marital Status:   Intimate Partner Violence:   .  Fear of Current or Ex-Partner:   . Emotionally Abused:   [employer] Physically Abused:   . Sexually Abused:    PHYSICAL EXAM  Vitals:   08/18/19 0956  BP: 118/72  Pulse: 74  Temp: 98.2 F (36.8 C)  Weight: 158 lb (71.7 kg)  Height: 5\' 4"  (1.626 m)   Body mass index is 27.12 kg/m.  Generalized: Well developed, in no acute distress   Neurological examination  Mentation: Alert oriented to time, place, history taking. Follows all commands speech and language fluent Cranial nerve II-XII: Pupils were equal round reactive to light. Extraocular movements were full, visual field were full on confrontational test. Facial sensation and strength were normal.  Head turning and shoulder shrug were normal and symmetric. Motor: The motor testing reveals 5 over 5 strength of all 4 extremities. Good symmetric motor tone is noted throughout.  Sensory: Sensory testing is intact to soft touch on all 4 extremities. No evidence of extinction is  noted.  Coordination: Cerebellar testing reveals good finger-nose-finger and heel-to-shin bilaterally.  Gait and station: Gait is normal. Tandem gait is normal. Romberg is negative. No drift is seen.  Reflexes: Deep tendon reflexes are symmetric and normal bilaterally.   DIAGNOSTIC DATA (LABS, IMAGING, TESTING) - I reviewed patient records, labs, notes, testing and imaging myself where available.  Lab Results  Component Value Date   WBC 4.2 02/01/2019   HGB 12.7 02/01/2019   HCT 40.4 02/01/2019   MCV 91.8 02/01/2019   PLT 251 02/01/2019      Component Value Date/Time   NA 138 02/01/2019 1353   NA 139 11/10/2015 1040   K 3.5 02/01/2019 1353   CL 104 02/01/2019 1353   CO2 27 02/01/2019 1353   GLUCOSE 89 02/01/2019 1353   BUN 6 02/01/2019 1353   BUN 11 11/10/2015 1040   CREATININE 1.02 (H) 02/01/2019 1353   CALCIUM 9.3 02/01/2019 1353   PROT 6.9 02/01/2019 1353   PROT 6.1 11/10/2015 1040   ALBUMIN 3.9 02/01/2019 1353   ALBUMIN 3.5 11/10/2015 1040   AST 14 (L) 02/01/2019 1353   ALT 12 02/01/2019 1353   ALKPHOS 72 02/01/2019 1353   BILITOT 0.6 02/01/2019 1353   BILITOT <0.2 11/10/2015 1040   GFRNONAA >60 02/01/2019 1353   GFRAA >60 02/01/2019 1353   No results found for: CHOL, HDL, LDLCALC, LDLDIRECT, TRIG, CHOLHDL No results found for: 02/03/2019 Lab Results  Component Value Date   VITAMINB12 247 11/10/2015   Lab Results  Component Value Date   TSH 2.810 11/10/2015    ASSESSMENT AND PLAN 34 y.o. year old female  has a past medical history of Migraines and Syncopal episodes. here with:  1.  Chronic migraine headache -Previously tried and failed nortriptyline, Zoloft, Topamax, trazodone -Stop taking nortriptyline intermittently, potential interaction with Risperdal -Try Aimovig 70 mg every month for headache prevention, discussed having family member, significant other, or even nurses visit for injection -Continue Maxalt, Zofran as needed for acute headache -Follow-up  in 6 months or sooner if needed   I spent 20 minutes of face-to-face and non-face-to-face time with patient.  This included previsit chart review, lab review, study review, order entry, electronic health record documentation, patient education.  20, AGNP-C, DNP 08/18/2019, 10:21 AM [facility] [address], [address] [place], [place] [phone]

## 2019-09-01 ENCOUNTER — Telehealth: Payer: Self-pay

## 2019-09-01 NOTE — Telephone Encounter (Signed)
Received PA request received for aimovig. Completed via covermymeds . Key: [id number]. Sent to [facility].  Request approved by [employer] FEP.  "The authorization is valid from 08/02/2019 through 02/28/2020. A letter of explanation will also be mailed to the patient."  [facility] notified.

## 2019-09-15 NOTE — Progress Notes (Signed)
I have reviewed and agreed above plan. 

## 2019-09-21 DIAGNOSIS — F411 Generalized anxiety disorder: Secondary | ICD-10-CM | POA: Diagnosis not present

## 2019-09-23 DIAGNOSIS — F331 Major depressive disorder, recurrent, moderate: Secondary | ICD-10-CM | POA: Diagnosis not present

## 2020-01-04 DIAGNOSIS — F411 Generalized anxiety disorder: Secondary | ICD-10-CM | POA: Diagnosis not present

## 2020-01-20 DIAGNOSIS — F411 Generalized anxiety disorder: Secondary | ICD-10-CM | POA: Diagnosis not present

## 2020-01-20 DIAGNOSIS — F331 Major depressive disorder, recurrent, moderate: Secondary | ICD-10-CM | POA: Diagnosis not present

## 2020-01-28 IMAGING — CT CT ABD-PELV W/ CM
2 of 4 series · 16 of 46 positions shown, 18 images · IV contrast (APPLIED)
Comparison: None.

CLINICAL DATA: Nausea, nausea and diarrhea for 1 week.

EXAM:
CT ABDOMEN AND PELVIS WITH CONTRAST
TECHNIQUE: Multidetector CT imaging of the abdomen and pelvis was performed
using the standard protocol following bolus administration of
intravenous contrast.
CONTRAST:  100mL OMNIPAQUE IOHEXOL 300 MG/ML  SOLN

[Series 4: abd/ pelvis 5.0 i30f 2 · axial · 0.88mm/px · z∈[-535,-150]mm · 13 of 85 slices shown, 15 images]
[im 4/85  soft-tissue]
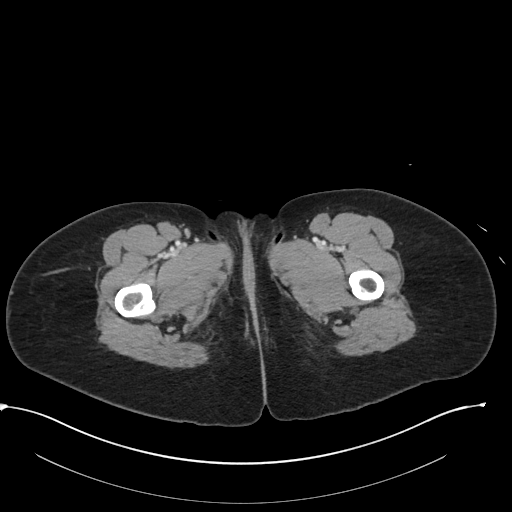
[im 4/85  bone]
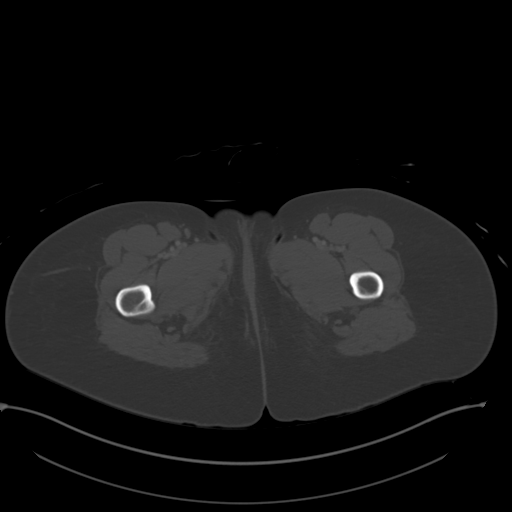
[im 10/85  soft-tissue]
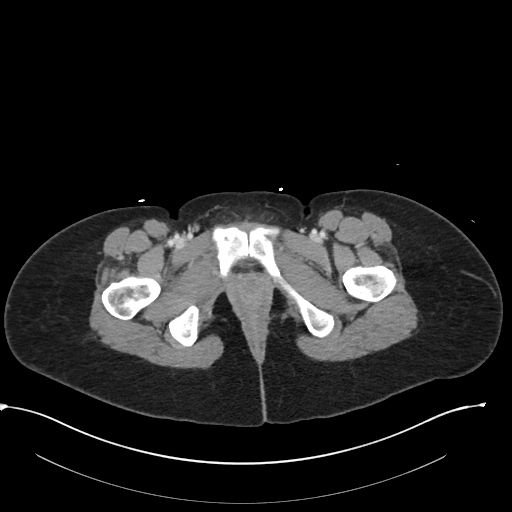
[im 17/85  soft-tissue]
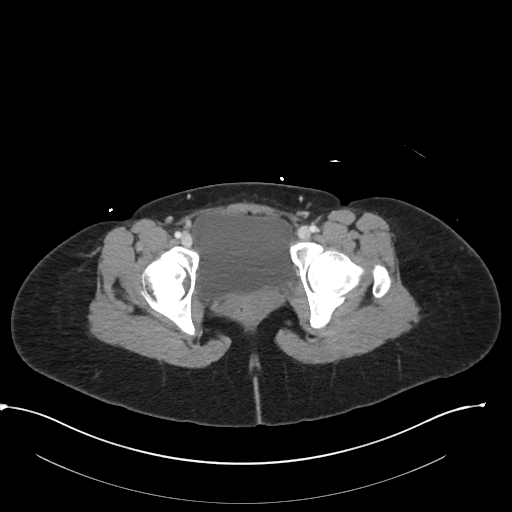
[im 23/85  soft-tissue]
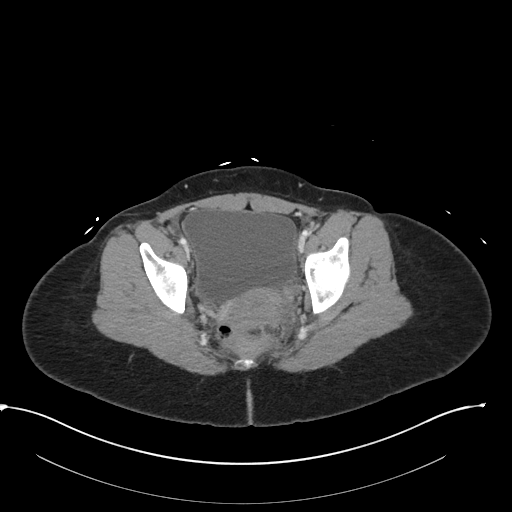
[im 30/85  soft-tissue]
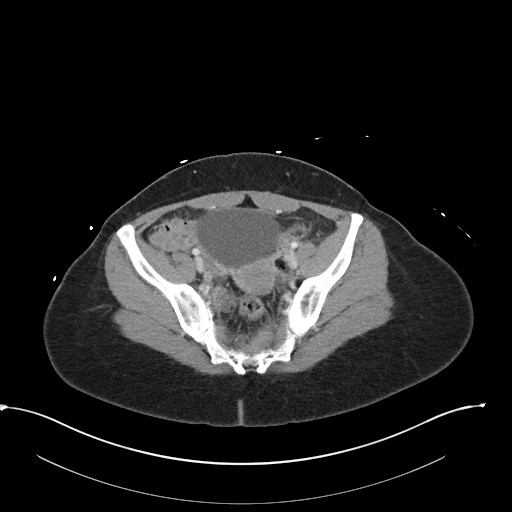
[im 36/85  soft-tissue]
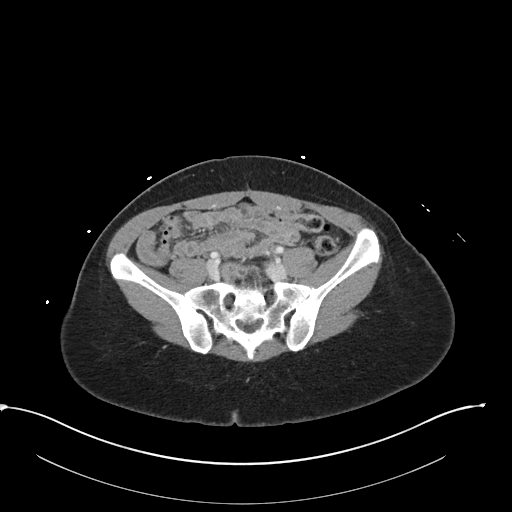
[im 43/85  soft-tissue]
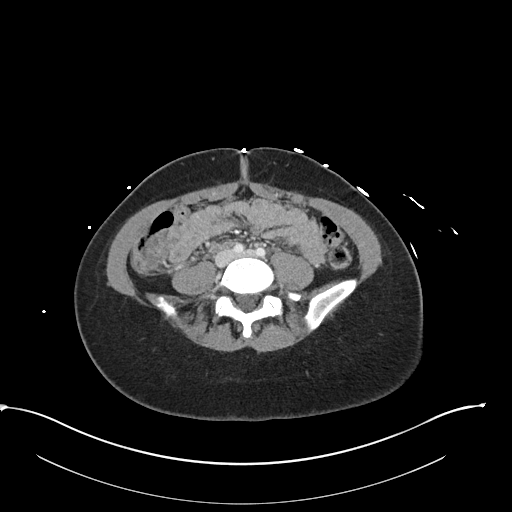
[im 49/85  soft-tissue]
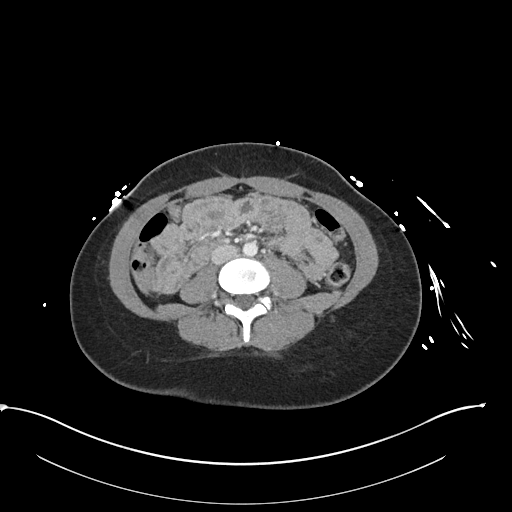
[im 55/85  soft-tissue]
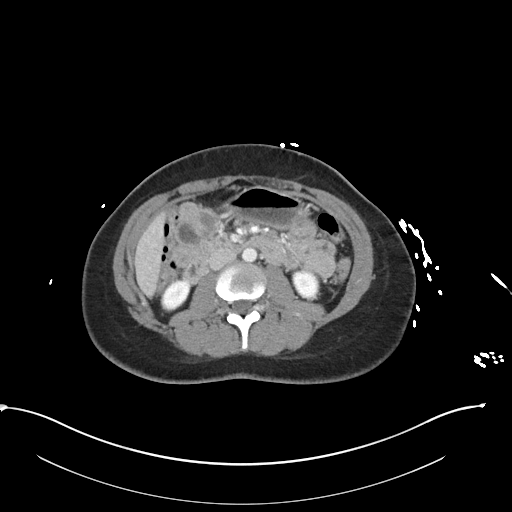
[im 55/85  bone]
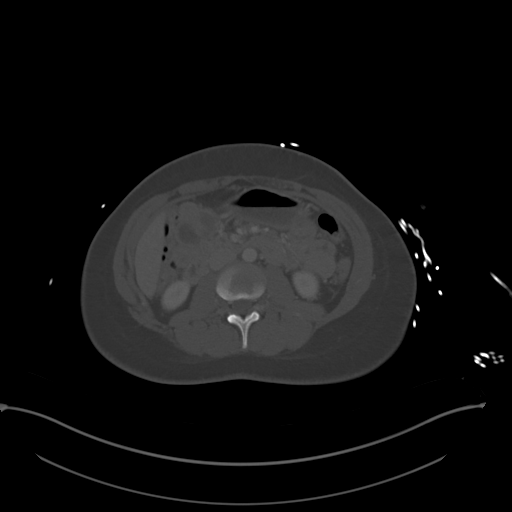
[im 62/85  soft-tissue]
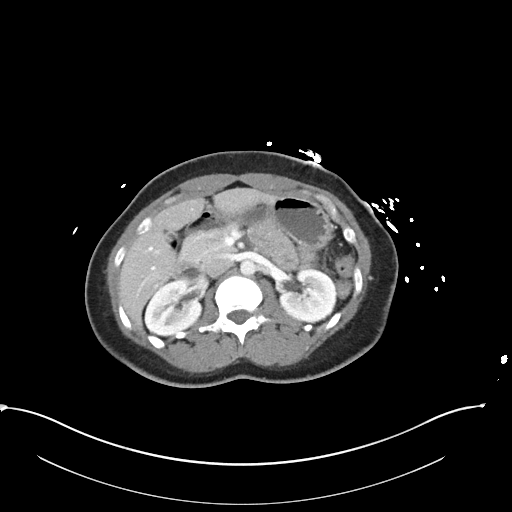
[im 68/85  soft-tissue]
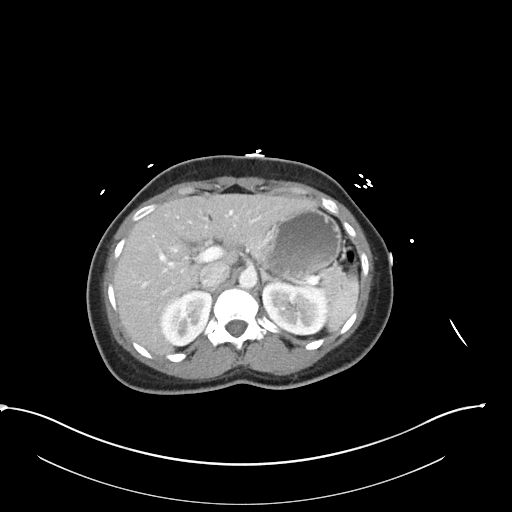
[im 75/85  soft-tissue]
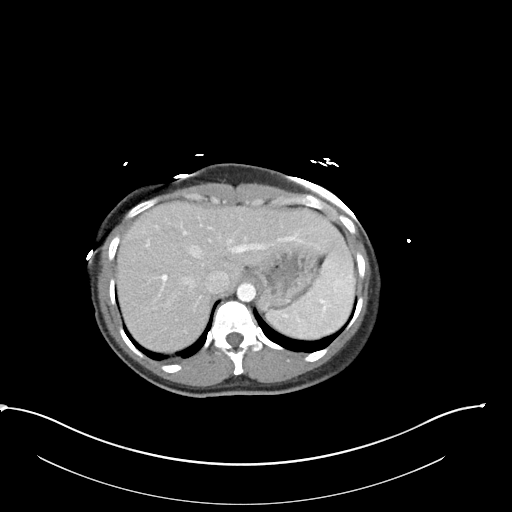
[im 81/85  soft-tissue]
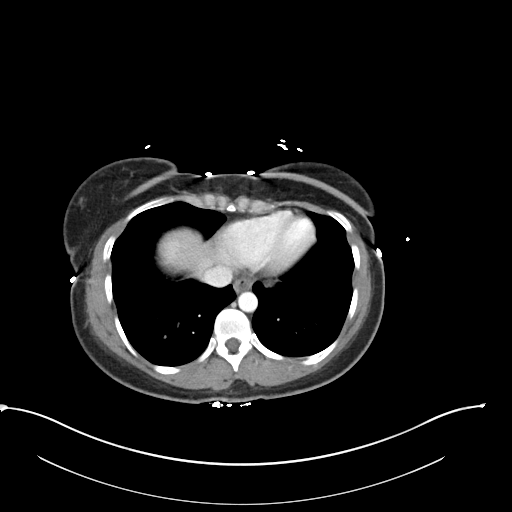

[Series 7: coronal soft tissue · coronal · 0.83mm/px · 3 of 101 slices shown]
[im 34/101  soft-tissue]
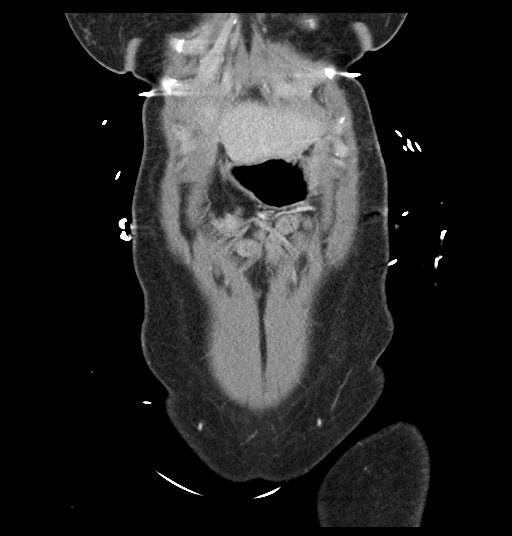
[im 45/101  soft-tissue]
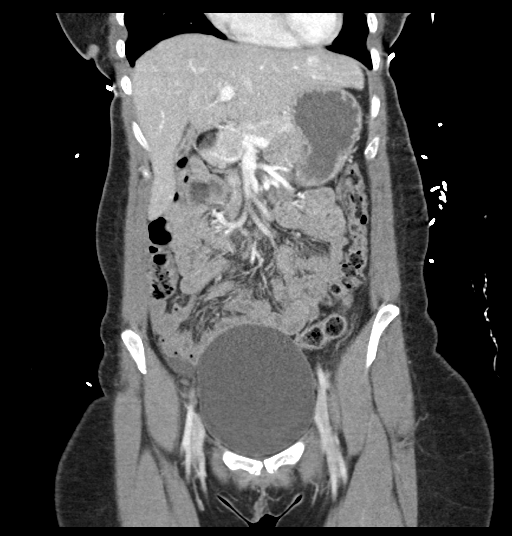
[im 56/101  soft-tissue]
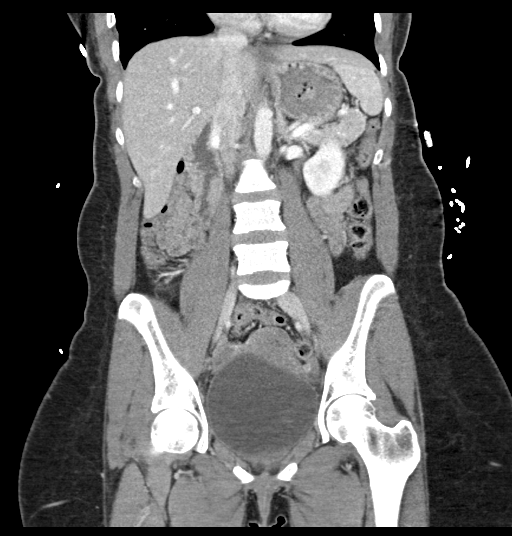

[16 of 46 positions shown; findings below may reference images not displayed]

FINDINGS: Lower chest: The lung bases are clear of acute process. No pleural
effusion or pulmonary lesions. The heart is normal in size. No
pericardial effusion. The distal esophagus and aorta are
unremarkable.

Hepatobiliary: There is a 2 cm enhancing lesion at the right hepatic
dome. This is an indeterminate finding but could reflect a flash
filling hemangioma, adenoma or FNH. Recommend MRI abdomen without
and with contrast for further evaluation.

Simple appearing right hepatic lobe cyst posteriorly is noted.

The gallbladder is contracted. No intra or extrahepatic biliary
dilatation.

Pancreas: No mass, inflammation or ductal dilatation.

Spleen: Normal size.  No focal lesions.

Adrenals/Urinary Tract: The adrenal glands and kidneys are
unremarkable. There are small simple cysts bilaterally. No renal or
obstructing ureteral calculi. No bladder calculi or mass.

Stomach/Bowel: The stomach, duodenum, small bowel and colon are
grossly normal without oral contrast. No acute inflammatory changes,
mass lesions or obstructive findings. I do not see the appendix for
certain but I do not see any findings to suggest acute appendicitis.

Vascular/Lymphatic: The aorta is normal in caliber. No dissection.
The branch vessels are patent. The major venous structures are
patent. No mesenteric or retroperitoneal mass or adenopathy. Small
scattered lymph nodes are noted.

Reproductive: The uterus and ovaries are unremarkable. Small rim
enhancing cyst associated with the right ovary is likely a corpus
luteum cyst.

Other: No significant free pelvic fluid collections. No pelvic
adenopathy. No inguinal adenopathy.

Musculoskeletal: No significant bony findings.
IMPRESSION: 1. Indeterminate 2 cm lesion at the hepatic dome. This could
represent an adenoma, FNH, flash filling hemangioma or possibly a
vascular shunt. Recommend MRI abdomen without and with contrast for
further evaluation.
2. No acute abdominal/pelvic findings, mass lesions or adenopathy.
3. Contracted gallbladder without definite gallstones.

## 2020-02-18 ENCOUNTER — Ambulatory Visit: Payer: Federal, State, Local not specified - PPO | Admitting: Neurology

## 2020-03-21 ENCOUNTER — Ambulatory Visit: Payer: Federal, State, Local not specified - PPO | Admitting: Neurology

## 2020-03-21 ENCOUNTER — Encounter: Payer: Self-pay | Admitting: Neurology

## 2020-05-17 ENCOUNTER — Other Ambulatory Visit: Payer: Federal, State, Local not specified - PPO

## 2020-05-17 DIAGNOSIS — Z20822 Contact with and (suspected) exposure to covid-19: Secondary | ICD-10-CM

## 2020-05-18 DIAGNOSIS — F411 Generalized anxiety disorder: Secondary | ICD-10-CM | POA: Diagnosis not present

## 2020-05-18 DIAGNOSIS — F331 Major depressive disorder, recurrent, moderate: Secondary | ICD-10-CM | POA: Diagnosis not present

## 2020-05-19 LAB — NOVEL CORONAVIRUS, NAA: SARS-CoV-2, NAA: NOT DETECTED

## 2020-05-19 LAB — SARS-COV-2, NAA 2 DAY TAT
